# Patient Record
Sex: Female | Born: 1985 | Race: Black or African American | Hispanic: No | Marital: Single | Smoking: Never smoker
Health system: Southern US, Community
[De-identification: ages and names within clinical notes are randomized; demographics above are authoritative.]

## PROBLEM LIST (undated history)

## (undated) DIAGNOSIS — M1990 Unspecified osteoarthritis, unspecified site: Secondary | ICD-10-CM

## (undated) DIAGNOSIS — R55 Syncope and collapse: Secondary | ICD-10-CM

## (undated) HISTORY — DX: Unspecified osteoarthritis, unspecified site: M19.90

## (undated) HISTORY — DX: Syncope and collapse: R55

---
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
## 2004-03-06 ENCOUNTER — Inpatient Hospital Stay (HOSPITAL_COMMUNITY): Admission: AD | Admit: 2004-03-06 | Discharge: 2004-03-06 | Payer: Self-pay | Admitting: Obstetrics

## 2004-03-16 ENCOUNTER — Inpatient Hospital Stay (HOSPITAL_COMMUNITY): Admission: AD | Admit: 2004-03-16 | Discharge: 2004-03-18 | Payer: Self-pay | Admitting: Obstetrics and Gynecology

## 2004-04-17 ENCOUNTER — Other Ambulatory Visit: Admission: RE | Admit: 2004-04-17 | Discharge: 2004-04-17 | Payer: Self-pay | Admitting: Obstetrics and Gynecology

## 2005-05-22 ENCOUNTER — Other Ambulatory Visit: Admission: RE | Admit: 2005-05-22 | Discharge: 2005-05-22 | Payer: Self-pay | Admitting: Obstetrics and Gynecology

## 2006-05-23 ENCOUNTER — Other Ambulatory Visit: Admission: RE | Admit: 2006-05-23 | Discharge: 2006-05-23 | Payer: Self-pay | Admitting: Obstetrics and Gynecology
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
## 2008-09-09 ENCOUNTER — Inpatient Hospital Stay (HOSPITAL_COMMUNITY): Admission: AD | Admit: 2008-09-09 | Discharge: 2008-09-09 | Payer: Self-pay | Admitting: Obstetrics and Gynecology

## 2008-10-05 ENCOUNTER — Inpatient Hospital Stay (HOSPITAL_COMMUNITY): Admission: AD | Admit: 2008-10-05 | Discharge: 2008-10-08 | Payer: Self-pay | Admitting: Obstetrics and Gynecology

## 2008-10-06 ENCOUNTER — Encounter (INDEPENDENT_AMBULATORY_CARE_PROVIDER_SITE_OTHER): Payer: Self-pay | Admitting: Obstetrics and Gynecology

## 2008-12-01 ENCOUNTER — Emergency Department (HOSPITAL_COMMUNITY): Admission: EM | Admit: 2008-12-01 | Discharge: 2008-12-01 | Payer: Self-pay | Admitting: Family Medicine

## 2010-04-12 LAB — CBC
HCT: 34.7 % — ABNORMAL LOW (ref 36.0–46.0)
Hemoglobin: 11.7 g/dL — ABNORMAL LOW (ref 12.0–15.0)
MCHC: 33.6 g/dL (ref 30.0–36.0)
MCV: 94.9 fL (ref 78.0–100.0)
Platelets: 122 10*3/uL — ABNORMAL LOW (ref 150–400)
RBC: 3.66 MIL/uL — ABNORMAL LOW (ref 3.87–5.11)
RDW: 14.2 % (ref 11.5–15.5)
WBC: 10.3 10*3/uL (ref 4.0–10.5)

## 2010-04-13 LAB — CBC
HCT: 36.1 % (ref 36.0–46.0)
Hemoglobin: 12 g/dL (ref 12.0–15.0)
MCHC: 33.1 g/dL (ref 30.0–36.0)
MCV: 94.4 fL (ref 78.0–100.0)
Platelets: 155 10*3/uL (ref 150–400)
RBC: 3.83 MIL/uL — ABNORMAL LOW (ref 3.87–5.11)
RDW: 14.3 % (ref 11.5–15.5)
WBC: 8.3 10*3/uL (ref 4.0–10.5)

## 2010-05-25 NOTE — Consult Note (Signed)
[facility]  Patient:    Courtney Hampton, Courtney Hampton                    MRN: [id number] Proc. Date: 10/14/99 Adm. Date:  [id number] Disc. Date: [id number] Attending:  Benny Lennert                          Consultation Report  DATE OF BIRTH:  1985/09/11.  REFERRING FACILITY:  [facility].  REASON FOR EVALUATION:  Fainting spells.  HISTORY OF PRESENT ILLNESS:  This is an initial emergency room consultation evaluation for this 25 year old black female with a past medical history of seasonal allergies, brought to the emergency room after three spells earlier today.  Patient reports that she went to sleep around 11 oclock last night, awoke at 8:30 today, ate two bowls of oatmeal and went back to bed.  She did not get up again until about 1 p.m., at which point she walked out of bed and was walking across the floor when she suddenly felt very light-headed and her vision went dark.  She then fell to the ground and the next thing she remembers is waking up on the floor.  Her stepfather witnessed the event and says that there was some twitching of her extremities but there was no posturing, no bowel or bladder incontinence and no injury.  The event lasted 30 to 60 seconds and she came around over a couple of minutes.  Patient reports that she felt fine after the episode but within the next 10 minutes, had two more identical spells, each time coming to complete recovery after a couple of minutes.  EMS was alerted after the second spell.  The patient received intravenous fluids and felt better after her arrival in the emergency room.  She reports that she has had similar dizzy sensations several times in the past, but she has never passed out before.  She denies any preceding aura of taste, smell or any other focal neurological sensation; she also denies chest pain, palpitations and shortness of breath.  PAST MEDICAL HISTORY:  Remarkable for  allergies.  FAMILY HISTORY:  Her mother reports that she had similar spells in her youth and grew out of it.  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  MEDICINES:  Allegra and Claritin p.r.n.  REVIEW OF SYSTEMS:  Denies fever, chills, chest pain or recent illness; shortness of breath, palpitations, nausea, vomiting, diarrhea, bowel or bladder symptoms.  PHYSICAL EXAMINATION  VITAL SIGNS:  Temperature 97.8, blood pressure 91/56, pulse 78, respirations 20.  GENERAL:  She is alert and in no evident distress.  HEENT:  Head is normocephalic and atraumatic.  Oropharynx is benign.  NECK:  Supple without bruit.  CHEST:  Clear to auscultation.  HEART:  Regular rate and rhythm without murmurs.  NEUROLOGIC:  Mental status:  She is awake, alert and completely oriented.  She is able to give a clear and concise history.  Mood is euthymic and affect appropriate.  Speech is fluent and not dysarthric.  Cranial nerves: Funduscopic exam is benign.  Pupils are equal and briskly reactive.  Visual fields are full to confrontation.  Extraocular movements are normal without nystagmus.  Facial strength and sensation are normal.  Tongue and palate move well and in the midline.  Motor exam:  Normal bulk and tone.  Normal strength in all tested extremity muscles.  No pronator drift.  Sensation is intact to  light touch in all extremities.  Cerebellar:  Rapid alternating movements are performed well.  Finger-to-nose is performed well.  Reflexes are 1+ globally. Toes are downgoing.  Gait is normal and she is able to tandem walk.  LABORATORY EXAMINATION:  CBC is normal.  CMET is remarkable for a minimally elevated glucose of 119.  EKG is normal except for low voltage.  Urine pregnancy test is negative and urinalysis is normal.  IMAGING STUDY:  CT of the head is normal.  IMPRESSION:  Syncope.  No evidence of seizures.  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  She has a family history  of similar spells.  RECOMMENDATIONS 1. Reassurance that these do not represent neurological phenomena. 2. I advised the patient to sit up out of bed slowly, to avoid starchy meals    and to stay well-hydrated. 3. Follow up with primary M.D.  She may warrant referral to a cardiologist or    possibly neurologist if the spells continue to be troublesome. DD:  10/14/99 TD:  10/15/99 Job: [id number]

## 2011-06-20 IMAGING — US US FETAL BPP W/O NONSTRESS
1 series · 10 of 10 positions shown · non-contrast
Comparison: none

OBSTETRICAL ULTRASOUND:
 This ultrasound exam was performed in the [HOSPITAL] Ultrasound Department.  The OB US report was generated in the AS system, and faxed to the ordering physician.  This report is also available in [REDACTED] PACS.

[Series 1: us fetal bpp w/o nonstress · non-contrast · 0.32mm/px · 10 acquisitions, 10 frames shown]
[im 1/10]
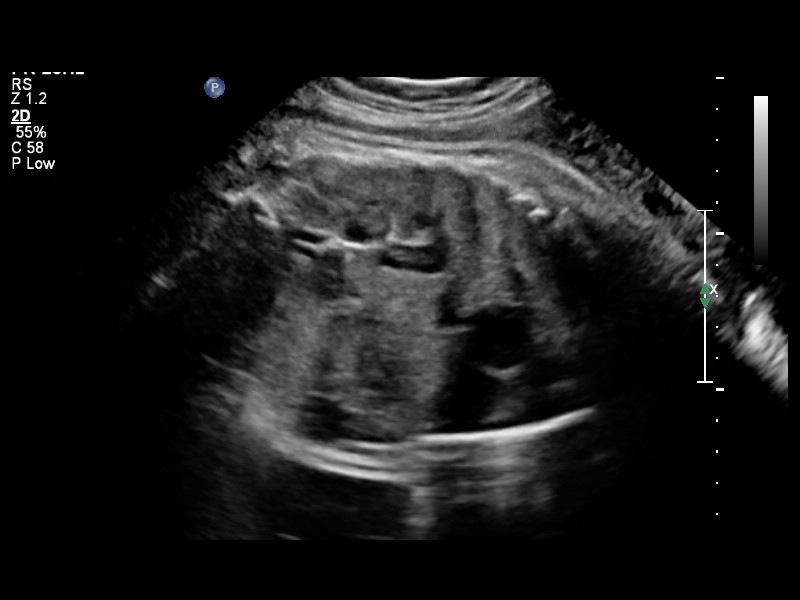
[im 2/10]
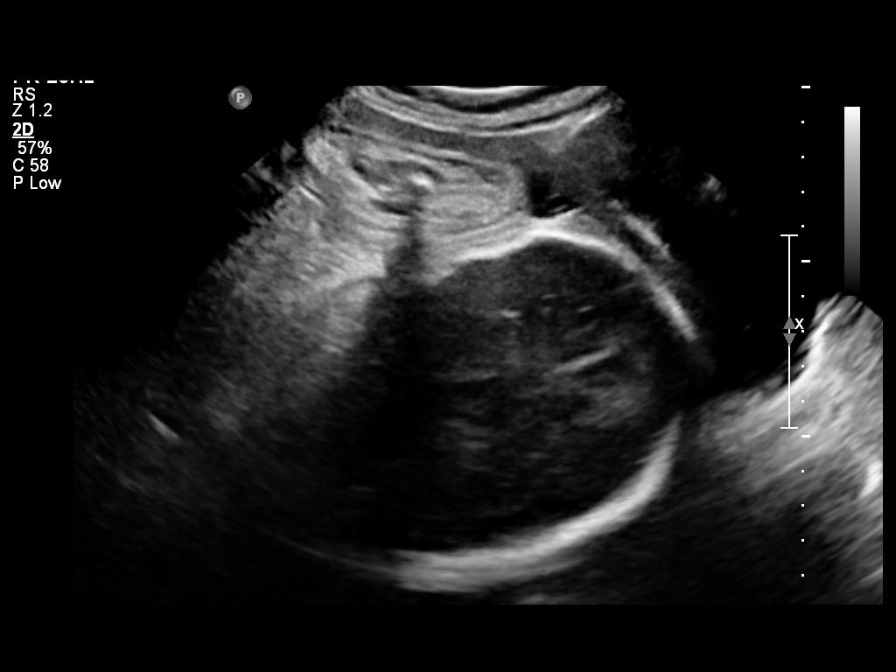
[im 3/10]
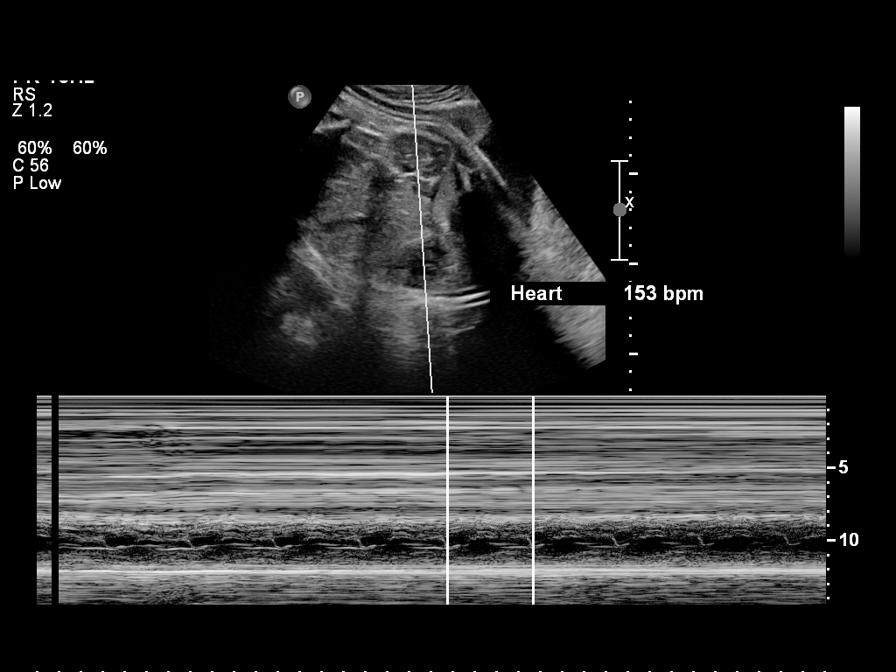
[im 4/10]
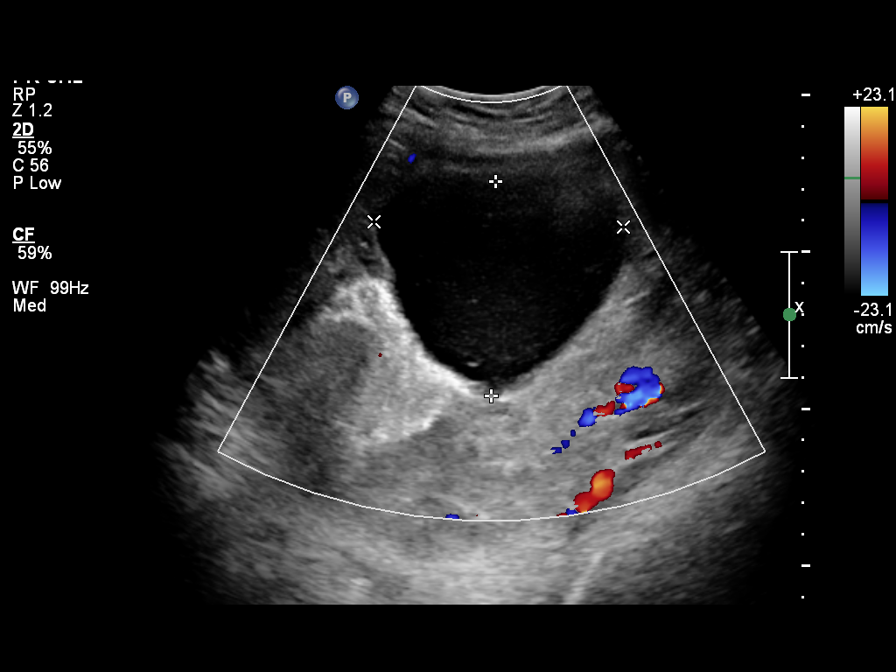
[im 5/10]
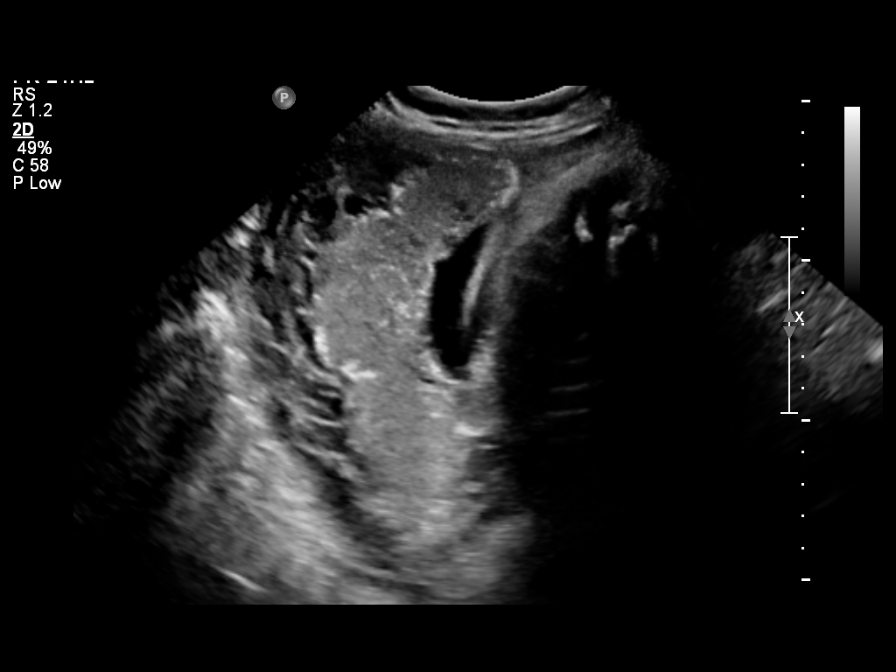
[im 6/10]
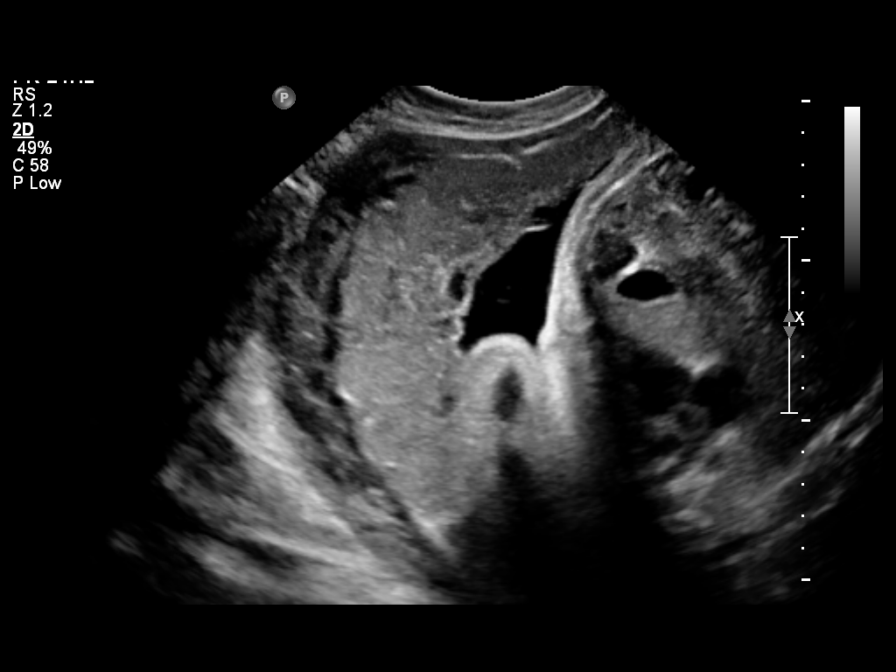
[im 7/10]
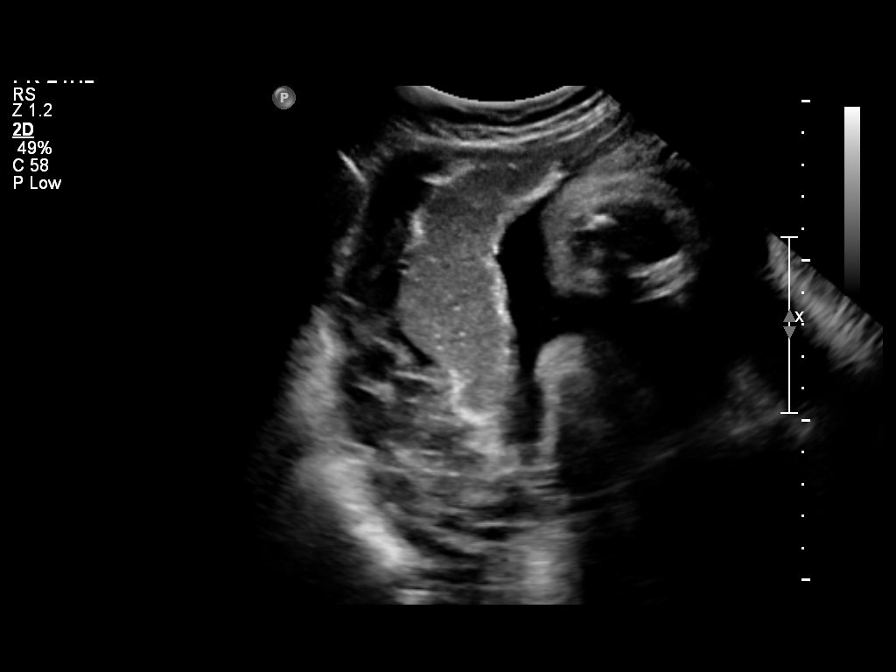
[im 8/10]
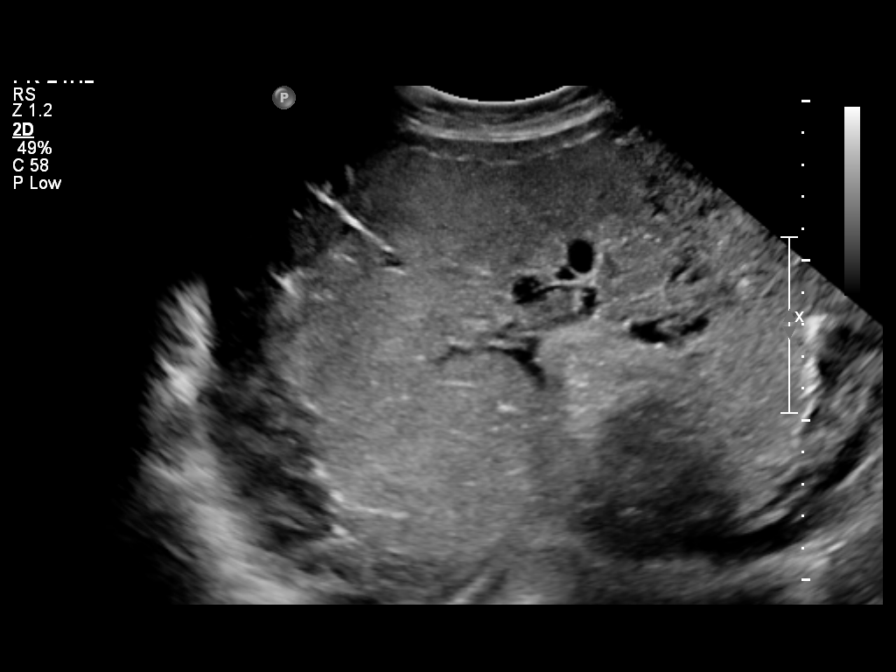
[im 9/10]
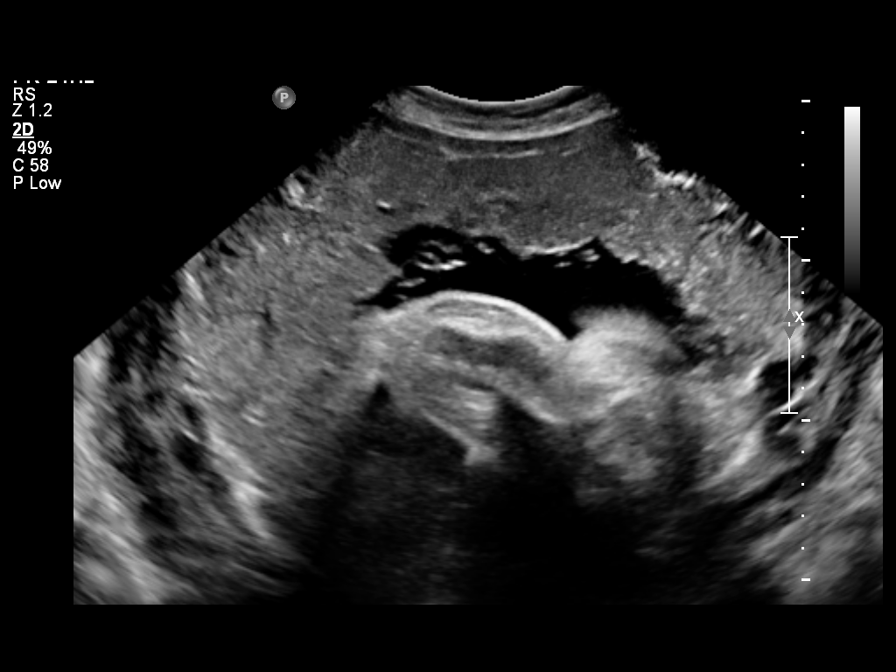
[im 10/10]
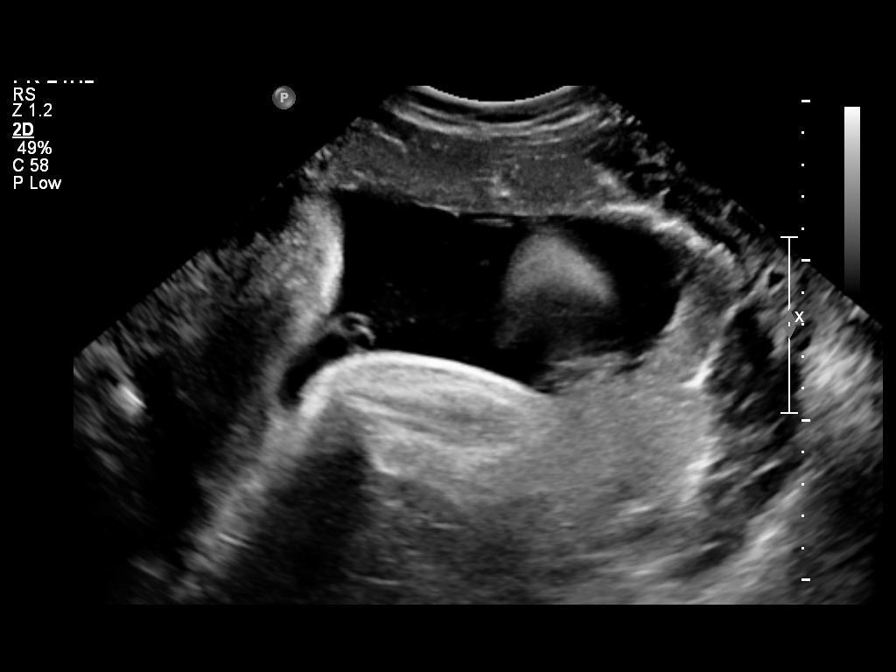

[10 of 10 positions shown; findings below may reference images not displayed]

IMPRESSION: See AS Obstetric US report.

## 2012-02-01 ENCOUNTER — Emergency Department (HOSPITAL_COMMUNITY)
Admission: EM | Admit: 2012-02-01 | Discharge: 2012-02-01 | Disposition: A | Discharge status: Home / Self Care | Payer: 59 | Source: Home / Self Care | Attending: Family Medicine | Admitting: Family Medicine

## 2012-02-01 DIAGNOSIS — J069 Acute upper respiratory infection, unspecified: Secondary | ICD-10-CM

## 2012-02-01 MED ORDER — DEXTROMETHORPHAN POLISTIREX 30 MG/5ML PO LQCR: 60.0000 mg | ORAL | Status: DC | PRN

## 2012-02-01 MED ORDER — IPRATROPIUM BROMIDE 0.06 % NA SOLN: 2.0000 | Freq: Four times a day (QID) | NASAL | Status: DC

## 2012-02-01 MED ORDER — AZITHROMYCIN 250 MG PO TABS: ORAL_TABLET | ORAL | Status: DC

## 2012-02-01 NOTE — Discharge Instructions (Signed)
Drink plenty of fluids as discussed, use medicine as prescribed, and mucinex or delsym for cough. Return or see your doctor if further problems °

## 2012-02-01 NOTE — ED Provider Notes (Signed)
History     CSN: [id number]  Arrival date & time 02/01/12  1230   First MD Initiated Contact with Patient 02/01/12 1247      Chief Complaint  Patient presents with  . URI    (Consider location/radiation/quality/duration/timing/severity/associated sxs/prior treatment) Patient is a 27 y.o. female presenting with URI. The history is provided by the patient.  URI The primary symptoms include sore throat and cough. Primary symptoms do not include fever, nausea or vomiting. The current episode started more than 1 week ago. This is a new problem. The problem has not changed since onset. Symptoms associated with the illness include congestion and rhinorrhea.    No past medical history on file.  No past surgical history on file.  No family history on file.  History  Substance Use Topics  . Smoking status: Not on file  . Smokeless tobacco: Not on file  . Alcohol Use: Not on file    OB History    No data available      Review of Systems  Constitutional: Negative.  Negative for fever.  HENT: Positive for congestion, sore throat, rhinorrhea and postnasal drip.   Respiratory: Positive for cough.   Gastrointestinal: Negative for nausea and vomiting.    Allergies  Penicillins  Home Medications   Current Outpatient Rx  Name  Route  Sig  Dispense  Refill  . AZITHROMYCIN 250 MG PO TABS      Take as directed on pack   6 each   0   . DEXTROMETHORPHAN POLISTIREX ER 30 MG/5ML PO LQCR   Oral   Take 10 mLs (60 mg total) by mouth as needed for cough. Twice a day.   89 mL   0   . IPRATROPIUM BROMIDE 0.06 % NA SOLN   Nasal   Place 2 sprays into the nose 4 (four) times daily.   15 mL   1     BP 106/66  Pulse 76  Temp 98.6 F (37 C) (Oral)  Resp 16  SpO2 100%  Physical Exam  Nursing note and vitals reviewed. Constitutional: She is oriented to person, place, and time. She appears well-developed and well-nourished.  HENT:  Head: Normocephalic.  Right Ear: External  ear normal.  Left Ear: External ear normal.  Nose: Nose normal.  Mouth/Throat: Oropharynx is clear and moist.  Eyes: Conjunctivae normal are normal. Pupils are equal, round, and reactive to light.  Neck: Normal range of motion. Neck supple.  Cardiovascular: Normal rate, regular rhythm, normal heart sounds and intact distal pulses.   Pulmonary/Chest: Effort normal and breath sounds normal.  Lymphadenopathy:    She has no cervical adenopathy.  Neurological: She is alert and oriented to person, place, and time.  Skin: Skin is warm and dry.    ED Course  Procedures (including critical care time)  Labs Reviewed - No data to display No results found.   1. URI (upper respiratory infection)       MDM          Linna Hoff, MD 02/01/12 1400

## 2012-02-01 NOTE — ED Notes (Signed)
Pt c/o poss sinus infection x1 week Sx include: cough w/green mucous, post nasal drip Denies: f/v/n/d, runny nose, nasal congestion, sinus pressure Taking allegra  She is alert w/no signs of acute distress.

## 2012-03-10 ENCOUNTER — Encounter (HOSPITAL_COMMUNITY): Payer: Self-pay | Admitting: *Deleted

## 2012-03-10 ENCOUNTER — Emergency Department (HOSPITAL_COMMUNITY)
Admission: EM | Admit: 2012-03-10 | Discharge: 2012-03-10 | Disposition: A | Discharge status: Home / Self Care | Payer: 59 | Source: Home / Self Care | Attending: Family Medicine | Admitting: Family Medicine

## 2012-03-10 DIAGNOSIS — S0083XA Contusion of other part of head, initial encounter: Secondary | ICD-10-CM

## 2012-03-10 MED ORDER — DICLOFENAC POTASSIUM 50 MG PO TABS: 50.0000 mg | ORAL_TABLET | Freq: Three times a day (TID) | ORAL | Status: DC
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
## 2012-08-21 ENCOUNTER — Other Ambulatory Visit: Payer: Self-pay | Admitting: Obstetrics & Gynecology

## 2012-08-21 DIAGNOSIS — N644 Mastodynia: Secondary | ICD-10-CM

## 2012-08-24 ENCOUNTER — Ambulatory Visit
Admission: RE | Admit: 2012-08-24 | Discharge: 2012-08-24 | Disposition: A | Discharge status: Home / Self Care | Payer: 59 | Source: Ambulatory Visit | Attending: Obstetrics & Gynecology | Admitting: Obstetrics & Gynecology

## 2012-08-24 DIAGNOSIS — N644 Mastodynia: Secondary | ICD-10-CM

## 2014-01-23 ENCOUNTER — Ambulatory Visit (INDEPENDENT_AMBULATORY_CARE_PROVIDER_SITE_OTHER): Payer: Managed Care, Other (non HMO) | Admitting: Family Medicine

## 2014-01-23 VITALS — BP 110/70 | HR 83 | Temp 98.7°F | Resp 16 | Ht 67.5 in | Wt 158.0 lb

## 2014-01-23 DIAGNOSIS — J209 Acute bronchitis, unspecified: Secondary | ICD-10-CM

## 2014-01-23 MED ORDER — AZITHROMYCIN 250 MG PO TABS: ORAL_TABLET | ORAL | Status: DC

## 2014-01-23 MED ORDER — PREDNISONE 20 MG PO TABS: 40.0000 mg | ORAL_TABLET | Freq: Every day | ORAL | Status: DC

## 2014-01-23 MED ORDER — HYDROCODONE-HOMATROPINE 5-1.5 MG/5ML PO SYRP: 5.0000 mL | ORAL_SOLUTION | Freq: Three times a day (TID) | ORAL | Status: DC | PRN

## 2014-01-23 NOTE — Patient Instructions (Signed)

## 2014-01-23 NOTE — Progress Notes (Signed)
° °  Subjective:  This chart was scribed for Elvina SidleKurt Lauenstein, MD, by Veverly FellsHatice Demirci,scribe, at [facility].  This patient was seen in room 2 and the patient's care was started at 8:29 AM.   Patient ID: Courtney Hampton, female    DOB: 27-Oct-1985, 29 y.o.   MRN: [id number]  HPI  HPI Comments: Courtney HawkingLatrece D Wanek is a 29 y.o. female who [facility] with a sore throat which started 5 days ago on Tuesday. Four days ago (Wednesday) she notes she started coughing up mucous and feels like she is being choked (which makes her wake up at night.  Patient notes last night she felt like her chest was tight. She notes she has not slept all week.   Patient denies smoking, asthma.   Patient denies congestion, runny nose, and states this is the first time this has occurred.   Patient works at [employer] [place]          There are no active problems to display for this patient.  History reviewed. No pertinent past medical history. History reviewed. No pertinent past surgical history. Allergies  Allergen Reactions   Penicillins    Prior to Admission medications   Not on File   History   Social History   Marital Status: Single    Spouse Name: N/A    Number of Children: N/A   Years of Education: N/A   Occupational History   Not on file.   Social History Main Topics   Smoking status: Never Smoker    Smokeless tobacco: Not on file   Alcohol Use: No   Drug Use: No   Sexual Activity: Not on file   Other Topics Concern   Not on file   Social History Narrative           Review of Systems  HENT: Negative for rhinorrhea.   Respiratory: Positive for cough.        Objective:   Physical Exam Patient is middle-aged woman in no acute distress appearing stated age HEENT: Unremarkable Chest: Clear after cough Heart: Regular no murmur Extremities: No edema Filed Vitals:   01/23/14 0817  BP: 110/70  Pulse: 83    Temp: 98.7 F (37.1 C)  TempSrc: Oral  Resp: 16  Height: 5' 7.5" (1.715 m)  Weight: 158 lb (71.668 kg)  SpO2: 99%        Assessment & Plan:    This chart was scribed in my presence and reviewed by me personally.    ICD-9-CM ICD-10-CM   1. Acute bronchitis, unspecified organism 466.0 J20.9 azithromycin (ZITHROMAX Z-PAK) 250 MG tablet     predniSONE (DELTASONE) 20 MG tablet     HYDROcodone-homatropine (HYCODAN) 5-1.5 MG/5ML syrup   this lady has what appears to be a violent cough which is been very common in last couple months. Signed, Elvina SidleKurt Lauenstein, MD

## 2014-03-03 DIAGNOSIS — Z0289 Encounter for other administrative examinations: Secondary | ICD-10-CM

## 2014-04-11 ENCOUNTER — Other Ambulatory Visit: Payer: Self-pay | Admitting: Obstetrics and Gynecology

## 2014-04-11 LAB — HM PAP SMEAR

## 2014-04-12 ENCOUNTER — Ambulatory Visit (INDEPENDENT_AMBULATORY_CARE_PROVIDER_SITE_OTHER): Payer: Managed Care, Other (non HMO) | Admitting: Internal Medicine

## 2014-04-12 ENCOUNTER — Other Ambulatory Visit (INDEPENDENT_AMBULATORY_CARE_PROVIDER_SITE_OTHER): Payer: Managed Care, Other (non HMO)

## 2014-04-12 ENCOUNTER — Encounter: Payer: Self-pay | Admitting: Internal Medicine

## 2014-04-12 VITALS — BP 90/50 | HR 74 | Temp 98.0°F | Resp 16 | Ht 65.5 in | Wt 156.0 lb

## 2014-04-12 DIAGNOSIS — G5602 Carpal tunnel syndrome, left upper limb: Secondary | ICD-10-CM | POA: Diagnosis not present

## 2014-04-12 DIAGNOSIS — R55 Syncope and collapse: Secondary | ICD-10-CM | POA: Insufficient documentation

## 2014-04-12 DIAGNOSIS — G5601 Carpal tunnel syndrome, right upper limb: Secondary | ICD-10-CM

## 2014-04-12 DIAGNOSIS — Z299 Encounter for prophylactic measures, unspecified: Secondary | ICD-10-CM

## 2014-04-12 DIAGNOSIS — R5383 Other fatigue: Secondary | ICD-10-CM

## 2014-04-12 DIAGNOSIS — G5603 Carpal tunnel syndrome, bilateral upper limbs: Secondary | ICD-10-CM

## 2014-04-12 DIAGNOSIS — Z23 Encounter for immunization: Secondary | ICD-10-CM

## 2014-04-12 DIAGNOSIS — Z418 Encounter for other procedures for purposes other than remedying health state: Secondary | ICD-10-CM

## 2014-04-12 LAB — TSH: TSH: 2.2 u[IU]/mL (ref 0.35–4.50)

## 2014-04-12 LAB — LIPID PANEL
CHOL/HDL RATIO: 4
CHOLESTEROL: 138 mg/dL (ref 0–200)
HDL: 38.3 mg/dL — ABNORMAL LOW (ref >39.00)
LDL CALC: 88 mg/dL (ref 0–99)
NonHDL: 99.7
TRIGLYCERIDES: 61 mg/dL (ref 0.0–149.0)
VLDL: 12.2 mg/dL (ref 0.0–40.0)

## 2014-04-12 LAB — COMPREHENSIVE METABOLIC PANEL
ALT: 15 U/L (ref 0–35)
AST: 21 U/L (ref 0–37)
Albumin: 3.9 g/dL (ref 3.5–5.2)
Alkaline Phosphatase: 61 U/L (ref 39–117)
BILIRUBIN TOTAL: 0.4 mg/dL (ref 0.2–1.2)
BUN: 12 mg/dL (ref 6–23)
CO2: 29 mEq/L (ref 19–32)
CREATININE: 0.61 mg/dL (ref 0.40–1.20)
Calcium: 9.5 mg/dL (ref 8.4–10.5)
Chloride: 102 mEq/L (ref 96–112)
GFR: 148.07 mL/min (ref >60.00)
GLUCOSE: 89 mg/dL (ref 70–99)
Potassium: 4.2 mEq/L (ref 3.5–5.1)
Sodium: 134 mEq/L — ABNORMAL LOW (ref 135–145)
TOTAL PROTEIN: 7.2 g/dL (ref 6.0–8.3)

## 2014-04-12 LAB — CBC
HEMATOCRIT: 35.7 % — AB (ref 36.0–46.0)
Hemoglobin: 12.1 g/dL (ref 12.0–15.0)
MCHC: 34 g/dL (ref 30.0–36.0)
MCV: 88.4 fl (ref 78.0–100.0)
Platelets: 207 10*3/uL (ref 150.0–400.0)
RBC: 4.04 Mil/uL (ref 3.87–5.11)
RDW: 12.7 % (ref 11.5–15.5)
WBC: 4.9 10*3/uL (ref 4.0–10.5)

## 2014-04-12 LAB — VITAMIN B12: VITAMIN B 12: 551 pg/mL (ref 211–911)

## 2014-04-12 LAB — CYTOLOGY - PAP

## 2014-04-12 LAB — FOLATE: FOLATE: 14.2 ng/mL (ref >5.9)

## 2014-04-12 NOTE — Patient Instructions (Signed)
We will check some blood work today and give you the tetanus shot (it is good for 10 years).   Keep working on exercising about 3 times per week to keep your body healthy and feeling energetic.   Health Maintenance Adopting a healthy lifestyle and getting preventive care can go a long way to promote health and wellness. Talk with your health care provider about what schedule of regular examinations is right for you. This is a good chance for you to check in with your provider about disease prevention and staying healthy. In between checkups, there are plenty of things you can do on your own. Experts have done a lot of research about which lifestyle changes and preventive measures are most likely to keep you healthy. Ask your health care provider for more information. WEIGHT AND DIET  Eat a healthy diet  Be sure to include plenty of vegetables, fruits, low-fat dairy products, and lean protein.  Do not eat a lot of foods high in solid fats, added sugars, or salt.  Get regular exercise. This is one of the most important things you can do for your health.  Most adults should exercise for at least 150 minutes each week. The exercise should increase your heart rate and make you sweat (moderate-intensity exercise).  Most adults should also do strengthening exercises at least twice a week. This is in addition to the moderate-intensity exercise.  Maintain a healthy weight  Body mass index (BMI) is a measurement that can be used to identify possible weight problems. It estimates body fat based on height and weight. Your health care provider can help determine your BMI and help you achieve or maintain a healthy weight.  For females 74 years of age and older:   A BMI below 18.5 is considered underweight.  A BMI of 18.5 to 24.9 is normal.  A BMI of 25 to 29.9 is considered overweight.  A BMI of 30 and above is considered obese.  Watch levels of cholesterol and blood lipids  You should start  having your blood tested for lipids and cholesterol at 30 years of age, then have this test every 5 years.  You may need to have your cholesterol levels checked more often if:  Your lipid or cholesterol levels are high.  You are older than 30 years of age.  You are at high risk for heart disease.  CANCER SCREENING   Lung Cancer  Lung cancer screening is recommended for adults 25-45 years old who are at high risk for lung cancer because of a history of smoking.  A yearly low-dose CT scan of the lungs is recommended for people who:  Currently smoke.  Have quit within the past 15 years.  Have at least a 30-pack-year history of smoking. A pack year is smoking an average of one pack of cigarettes a day for 1 year.  Yearly screening should continue until it has been 15 years since you quit.  Yearly screening should stop if you develop a health problem that would prevent you from having lung cancer treatment.  Breast Cancer  Practice breast self-awareness. This means understanding how your breasts normally appear and feel.  It also means doing regular breast self-exams. Let your health care provider know about any changes, no matter how small.  If you are in your 20s or 30s, you should have a clinical breast exam (CBE) by a health care provider every 1-3 years as part of a regular health exam.  If you are  55 or older, have a CBE every year. Also consider having a breast X-ray (mammogram) every year.  If you have a family history of breast cancer, talk to your health care provider about genetic screening.  If you are at high risk for breast cancer, talk to your health care provider about having an MRI and a mammogram every year.  Breast cancer gene (BRCA) assessment is recommended for women who have family members with BRCA-related cancers. BRCA-related cancers include:  Breast.  Ovarian.  Tubal.  Peritoneal cancers.  Results of the assessment will determine the need for  genetic counseling and BRCA1 and BRCA2 testing. Cervical Cancer Routine pelvic examinations to screen for cervical cancer are no longer recommended for nonpregnant women who are considered low risk for cancer of the pelvic organs (ovaries, uterus, and vagina) and who do not have symptoms. A pelvic examination may be necessary if you have symptoms including those associated with pelvic infections. Ask your health care provider if a screening pelvic exam is right for you.   The Pap test is the screening test for cervical cancer for women who are considered at risk.  If you had a hysterectomy for a problem that was not cancer or a condition that could lead to cancer, then you no longer need Pap tests.  If you are older than 65 years, and you have had normal Pap tests for the past 10 years, you no longer need to have Pap tests.  If you have had past treatment for cervical cancer or a condition that could lead to cancer, you need Pap tests and screening for cancer for at least 20 years after your treatment.  If you no longer get a Pap test, assess your risk factors if they change (such as having a new sexual partner). This can affect whether you should start being screened again.  Some women have medical problems that increase their chance of getting cervical cancer. If this is the case for you, your health care provider may recommend more frequent screening and Pap tests.  The human papillomavirus (HPV) test is another test that may be used for cervical cancer screening. The HPV test looks for the virus that can cause cell changes in the cervix. The cells collected during the Pap test can be tested for HPV.  The HPV test can be used to screen women 54 years of age and older. Getting tested for HPV can extend the interval between normal Pap tests from three to five years.  An HPV test also should be used to screen women of any age who have unclear Pap test results.  After 30 years of age, women  should have HPV testing as often as Pap tests.  Colorectal Cancer  This type of cancer can be detected and often prevented.  Routine colorectal cancer screening usually begins at 30 years of age and continues through 30 years of age.  Your health care provider may recommend screening at an earlier age if you have risk factors for colon cancer.  Your health care provider may also recommend using home test kits to check for hidden blood in the stool.  A small camera at the end of a tube can be used to examine your colon directly (sigmoidoscopy or colonoscopy). This is done to check for the earliest forms of colorectal cancer.  Routine screening usually begins at age 65.  Direct examination of the colon should be repeated every 5-10 years through 30 years of age. However, you may need  to be screened more often if early forms of precancerous polyps or small growths are found. Skin Cancer  Check your skin from head to toe regularly.  Tell your health care provider about any new moles or changes in moles, especially if there is a change in a mole's shape or color.  Also tell your health care provider if you have a mole that is larger than the size of a pencil eraser.  Always use sunscreen. Apply sunscreen liberally and repeatedly throughout the day.  Protect yourself by wearing long sleeves, pants, a wide-brimmed hat, and sunglasses whenever you are outside. HEART DISEASE, DIABETES, AND HIGH BLOOD PRESSURE   Have your blood pressure checked at least every 1-2 years. High blood pressure causes heart disease and increases the risk of stroke.  If you are between 102 years and 79 years old, ask your health care provider if you should take aspirin to prevent strokes.  Have regular diabetes screenings. This involves taking a blood sample to check your fasting blood sugar level.  If you are at a normal weight and have a low risk for diabetes, have this test once every three years after 30 years  of age.  If you are overweight and have a high risk for diabetes, consider being tested at a younger age or more often. PREVENTING INFECTION  Hepatitis B  If you have a higher risk for hepatitis B, you should be screened for this virus. You are considered at high risk for hepatitis B if:  You were born in a country where hepatitis B is common. Ask your health care provider which countries are considered high risk.  Your parents were born in a high-risk country, and you have not been immunized against hepatitis B (hepatitis B vaccine).  You have HIV or AIDS.  You use needles to inject street drugs.  You live with someone who has hepatitis B.  You have had sex with someone who has hepatitis B.  You get hemodialysis treatment.  You take certain medicines for conditions, including cancer, organ transplantation, and autoimmune conditions. Hepatitis C  Blood testing is recommended for:  Everyone born from [birth year] through 1966.  Anyone with known risk factors for hepatitis C. Sexually transmitted infections (STIs)  You should be screened for sexually transmitted infections (STIs) including gonorrhea and chlamydia if:  You are sexually active and are younger than 30 years of age.  You are older than 30 years of age and your health care provider tells you that you are at risk for this type of infection.  Your sexual activity has changed since you were last screened and you are at an increased risk for chlamydia or gonorrhea. Ask your health care provider if you are at risk.  If you do not have HIV, but are at risk, it may be recommended that you take a prescription medicine daily to prevent HIV infection. This is called pre-exposure prophylaxis (PrEP). You are considered at risk if:  You are sexually active and do not regularly use condoms or know the HIV status of your partner(s).  You take drugs by injection.  You are sexually active with a partner who has HIV. Talk with your  health care provider about whether you are at high risk of being infected with HIV. If you choose to begin PrEP, you should first be tested for HIV. You should then be tested every 3 months for as long as you are taking PrEP.  PREGNANCY   If you are premenopausal  and you may become pregnant, ask your health care provider about preconception counseling.  If you may become pregnant, take 400 to 800 micrograms (mcg) of folic acid every day.  If you want to prevent pregnancy, talk to your health care provider about birth control (contraception). OSTEOPOROSIS AND MENOPAUSE   Osteoporosis is a disease in which the bones lose minerals and strength with aging. This can result in serious bone fractures. Your risk for osteoporosis can be identified using a bone density scan.  If you are 34 years of age or older, or if you are at risk for osteoporosis and fractures, ask your health care provider if you should be screened.  Ask your health care provider whether you should take a calcium or vitamin D supplement to lower your risk for osteoporosis.  Menopause may have certain physical symptoms and risks.  Hormone replacement therapy may reduce some of these symptoms and risks. Talk to your health care provider about whether hormone replacement therapy is right for you.  HOME CARE INSTRUCTIONS   Schedule regular health, dental, and eye exams.  Stay current with your immunizations.   Do not use any tobacco products including cigarettes, chewing tobacco, or electronic cigarettes.  If you are pregnant, do not drink alcohol.  If you are breastfeeding, limit how much and how often you drink alcohol.  Limit alcohol intake to no more than 1 drink per day for nonpregnant women. One drink equals 12 ounces of beer, 5 ounces of wine, or 1 ounces of hard liquor.  Do not use street drugs.  Do not share needles.  Ask your health care provider for help if you need support or information about quitting  drugs.  Tell your health care provider if you often feel depressed.  Tell your health care provider if you have ever been abused or do not feel safe at home. Document Released: 07/09/2010 Document Revised: 05/10/2013 Document Reviewed: 11/25/2012 [facility] Patient Information 2015 [place], [place]. This information is not intended to replace advice given to you by your health care provider. Make sure you discuss any questions you have with your health care provider.

## 2014-04-12 NOTE — Progress Notes (Signed)
Pre visit review using our clinic review tool, if applicable. No additional management support is needed unless otherwise documented below in the visit note. 

## 2014-04-14 ENCOUNTER — Encounter: Payer: Self-pay | Admitting: Internal Medicine

## 2014-04-14 DIAGNOSIS — G5600 Carpal tunnel syndrome, unspecified upper limb: Secondary | ICD-10-CM | POA: Insufficient documentation

## 2014-04-14 NOTE — Progress Notes (Signed)
   Subjective:    Patient ID: Courtney HawkingLatrece D Bolds, female    DOB: 29-Apr-1985, 29 y.o.   MRN: [id number]  HPI The patient is a 29 YO female who is coming in for hand numbness and tingling. Mostly after she is working at her job for long times. She has noticed it in the last 6 months and it has been stable since then. A mild annoyance. Has not tried anything for it and has not sought care for it in the past. Does not involve the upper arm.   [facility], social history reviewed and updated.   Review of Systems  Constitutional: Negative for fever, activity change, appetite change, fatigue and unexpected weight change.  HENT: Negative.   Eyes: Negative.   Respiratory: Negative for cough, chest tightness, shortness of breath and wheezing.   Cardiovascular: Negative for chest pain, palpitations and leg swelling.  Gastrointestinal: Negative for abdominal pain, diarrhea, constipation and abdominal distention.  Musculoskeletal: Negative for myalgias, arthralgias, gait problem and neck pain.  Skin: Negative.   Neurological: Positive for numbness. Negative for dizziness, syncope, speech difficulty, weakness, light-headedness and headaches.  Psychiatric/Behavioral: Negative.       Objective:   Physical Exam  Constitutional: She is oriented to person, place, and time. She appears well-developed and well-nourished.  HENT:  Head: Normocephalic and atraumatic.  Eyes: EOM are normal.  Neck: Normal range of motion.  Cardiovascular: Normal rate and regular rhythm.   Pulmonary/Chest: Effort normal and breath sounds normal. No respiratory distress. She has no wheezes. She has no rales.  Abdominal: Soft. Bowel sounds are normal. She exhibits no distension. There is no tenderness. There is no rebound.  Musculoskeletal: She exhibits no edema.  Neurological: She is alert and oriented to person, place, and time. Coordination normal.  Skin: Skin is warm and dry.  Psychiatric: She has a normal mood and affect.    Filed Vitals:   04/12/14 1402  BP: 90/50  Pulse: 74  Temp: 98 F (36.7 C)  TempSrc: Oral  Resp: 16  Height: 5' 5.5" (1.664 m)  Weight: 156 lb (70.761 kg)  SpO2: 95%      Assessment & Plan:

## 2014-04-14 NOTE — Assessment & Plan Note (Signed)
Most likely diagnosis. Will trial splinting and see if this helps. If no relief she would like to do nerve conduction test to confirm and will pursue if no improvement. Other etiology are much less likely including cervical dysfunction since no prior injury and young.

## 2014-08-08 ENCOUNTER — Ambulatory Visit (INDEPENDENT_AMBULATORY_CARE_PROVIDER_SITE_OTHER): Payer: Managed Care, Other (non HMO) | Admitting: Internal Medicine

## 2014-08-08 ENCOUNTER — Encounter: Payer: Self-pay | Admitting: Internal Medicine

## 2014-08-08 VITALS — BP 94/64 | HR 72 | Temp 98.6°F | Resp 16 | Ht 65.0 in | Wt 149.8 lb

## 2014-08-08 DIAGNOSIS — G5601 Carpal tunnel syndrome, right upper limb: Secondary | ICD-10-CM

## 2014-08-08 DIAGNOSIS — B379 Candidiasis, unspecified: Secondary | ICD-10-CM | POA: Diagnosis not present

## 2014-08-08 DIAGNOSIS — G5602 Carpal tunnel syndrome, left upper limb: Secondary | ICD-10-CM | POA: Diagnosis not present

## 2014-08-08 DIAGNOSIS — G5603 Carpal tunnel syndrome, bilateral upper limbs: Secondary | ICD-10-CM

## 2014-08-08 MED ORDER — FLUCONAZOLE 150 MG PO TABS
150.0000 mg | ORAL_TABLET | ORAL | Status: DC
Start: 2014-08-08 — End: 2015-12-07

## 2014-08-08 NOTE — Assessment & Plan Note (Signed)
Refer to hand surgery for injection versus evaluation for surgery for her carpal tunnel. Is bilateral but worse in the left.

## 2014-08-08 NOTE — Progress Notes (Signed)
Pre visit review using our clinic review tool, if applicable. No additional management support is needed unless otherwise documented below in the visit note. 

## 2014-08-08 NOTE — Progress Notes (Signed)
   Subjective:    Patient ID: Courtney Hampton, female    DOB: March 23, 1985, 29 y.o.   MRN: [id number]  HPI The patient is a 29 YO female coming in for 2 acute complaints. The first is recurrent yeast infections that have been going on the last 1.5 years. She has talked to her gyn but is not satisfied. She had her mirena replaced about 1 year ago when the worsening started. She was treated a couple weeks ago and still having some discharge. More than 2 infections per month which are extended (1-2 weeks each episode). Has made many changes in her diet to help with it without success.  Next problem is her carpal tunnel. In her left arm worsening to the point where she cannot drive without numbness or pain. She keeps it in a wrap which helps some but not that much. Worse in the last month. 8/10 pain.   Review of Systems  Constitutional: Negative.   Respiratory: Negative.   Cardiovascular: Negative.   Gastrointestinal: Negative.   Genitourinary: Positive for vaginal discharge. Negative for urgency, frequency, flank pain, vaginal bleeding, genital sores, vaginal pain and pelvic pain.  Musculoskeletal: Positive for arthralgias.  Neurological: Positive for numbness. Negative for dizziness, weakness and headaches.      Objective:   Physical Exam  Constitutional: She is oriented to person, place, and time. She appears well-developed and well-nourished.  HENT:  Head: Normocephalic and atraumatic.  Eyes: EOM are normal.  Cardiovascular: Normal rate and regular rhythm.   Pulmonary/Chest: Effort normal. No respiratory distress. She has no wheezes.  Abdominal: Soft. Bowel sounds are normal.  Neurological: She is alert and oriented to person, place, and time. Coordination normal.  Skin: Skin is warm and dry.   Filed Vitals:   08/08/14 1351  BP: 94/64  Pulse: 72  Temp: 98.6 F (37 C)  TempSrc: Oral  Resp: 16  Height:  (1.651 m)  Weight: 149 lb 12.8 oz (67.949 kg)  SpO2: 99%        Assessment & Plan:

## 2014-08-08 NOTE — Patient Instructions (Signed)
We have sent in the prescription for the diflucan. We would like you to take 1 pill every 3 days for 2 weeks. Then take 1 pill weekly for 3 months. We will see if we can eliminate the yeast.   We will also get you in with the hand surgeon for the carpal tunnel to get you doing better.

## 2014-08-08 NOTE — Assessment & Plan Note (Signed)
Will try extended diflucan taper with q 3 days for 2 weeks then weekly for 3 months. If no resolution have asked her to consider getting rid of her IUD and talked to her about other contraception options including implant which would provide the same protection.

## 2014-08-15 ENCOUNTER — Other Ambulatory Visit: Payer: Self-pay | Admitting: Obstetrics & Gynecology

## 2014-10-18 ENCOUNTER — Ambulatory Visit: Payer: Managed Care, Other (non HMO) | Admitting: Internal Medicine

## 2014-10-18 DIAGNOSIS — Z0289 Encounter for other administrative examinations: Secondary | ICD-10-CM

## 2014-11-01 ENCOUNTER — Ambulatory Visit: Payer: Managed Care, Other (non HMO) | Admitting: Internal Medicine

## 2014-12-20 ENCOUNTER — Telehealth: Payer: Self-pay

## 2014-12-20 NOTE — Telephone Encounter (Signed)
Left Voice Mail for pt to call back.   RE: Flu Vaccine for 2016  

## 2015-03-14 ENCOUNTER — Telehealth: Payer: Self-pay | Admitting: Internal Medicine

## 2015-03-14 NOTE — Telephone Encounter (Signed)
[facility] - Client TELEPHONE ADVICE RECORD [facility] Patient Name: Courtney Hampton DOB: Jul 24, 1985 Initial Comment Caller states she has had diarrhea for about 10 days- went to an urgent care [facility]- dx stomach bug- There was a little pink when she went to go. unsure if it is blood in stool to area is raw Nurse Assessment Nurse: Izola PriceMyers, RN, Cala BradfordKimberly Date/Time Lamount Cohen(Eastern Time): 03/14/2015 3:14:26 PM Confirm and document reason for call. If symptomatic, describe symptoms. You must click the next button to save text entered. ---Caller states she has had diarrhea for about 10 days- went to an urgent care [facility]- dx stomach bug- There was a little pink when she went to go. unsure if it is blood in stool to area is raw. No abd. pain but does rumble. 5 or 6 stools per day. Has had no stool today but when she eats, this will cause stool so pt hasn't eaten. Afebrile and no vomiting. Drops of blood and not sure if stool was mixed with blood due to large amt of stool. Has the patient traveled out of the country within the last 30 days? ---No Does the patient have any new or worsening symptoms? ---Yes Will a triage be completed? ---Yes Related visit to physician within the last 2 weeks? ---Yes Does the PT have any chronic conditions? (i.e. diabetes, asthma, etc.) ---No Is the patient pregnant or possibly pregnant? (Ask all females between the ages of 5412-55) ---No Is this a behavioral health or substance abuse call? ---No Guidelines Guideline Title Affirmed Question Affirmed Notes Diarrhea [1] MODERATE diarrhea (e.g., 4-6 times / day more than normal) AND [2] present > 48 hours (2 days) Final Disposition User See Physician within 24 Hours HiddeniteMyers, Charity fundraiserN, RaytheonKimberly

## 2015-03-14 NOTE — Telephone Encounter (Signed)
Appt has been made for tomorrow 03/15/15 w/Carrie doss, NP.../lmb

## 2015-03-15 ENCOUNTER — Ambulatory Visit (INDEPENDENT_AMBULATORY_CARE_PROVIDER_SITE_OTHER): Payer: Managed Care, Other (non HMO) | Admitting: Nurse Practitioner

## 2015-03-15 ENCOUNTER — Encounter: Payer: Self-pay | Admitting: Nurse Practitioner

## 2015-03-15 ENCOUNTER — Other Ambulatory Visit: Payer: Managed Care, Other (non HMO)

## 2015-03-15 VITALS — BP 110/70 | HR 71 | Temp 98.7°F | Wt 148.0 lb

## 2015-03-15 DIAGNOSIS — A09 Infectious gastroenteritis and colitis, unspecified: Secondary | ICD-10-CM

## 2015-03-15 DIAGNOSIS — R197 Diarrhea, unspecified: Secondary | ICD-10-CM

## 2015-03-15 NOTE — Progress Notes (Signed)
Patient ID: Courtney HawkingLatrece D Hampton, female    DOB: June 25, 1985  Age: 30 y.o. MRN: [id number]  CC: Diarrhea   HPI Courtney Hampton presents for CC of diarrhea x 11 days.   1) Pt reports she may have seen blood in stool on Monday Tried imodium OTC with no relief  Preparation H- pruritic   Clindamycin For tooth removal in early Feb. Was on abx for 2 weeks straight.   Feels fine except for diarrhea and a little bit of nausea  Poor appetite due to being scared to eat   First 3-4 days going every 30 minutes First 7 days days- water  Slowed down to twice daily and loose  Monday possibly blood on toilet paper with 2 BMs on that day   Pink and small amount   Probiotic daily since Friday  Staying hydrated, urinating regularly pale yellow   Daughter had stomach bug prior to her having this UC- told her it was Norovirus Friday   History Amayrani has a past medical history of Arthritis and Fainting spell.   She has no past surgical history on file.   Her family history includes Arthritis in her maternal grandfather, maternal grandmother, paternal grandfather, and paternal grandmother; Cancer in her paternal aunt; Diabetes in her maternal grandmother, mother, and paternal grandfather; Heart attack in her father.She reports that she has never smoked. She has never used smokeless tobacco. She reports that she does not drink alcohol or use illicit drugs.  Outpatient Prescriptions Prior to Visit  Medication Sig Dispense Refill  . fluconazole (DIFLUCAN) 150 MG tablet Take 1 tablet (150 mg total) by mouth every 3 (three) days. 15 tablet 2  . Lactobacillus (PROBIOTIC ACIDOPHILUS PO) Take by mouth. Take one by mouth daily.     No facility-administered medications prior to visit.    ROS Review of Systems  Constitutional: Negative for fever, chills, diaphoresis and fatigue.  Respiratory: Negative for chest tightness, shortness of breath and wheezing.   Cardiovascular: Negative for chest pain,  palpitations and leg swelling.  Gastrointestinal: Positive for diarrhea, anal bleeding and rectal pain. Negative for nausea, vomiting, abdominal pain, constipation, blood in stool and abdominal distention.  Skin: Negative for rash.  Neurological: Negative for dizziness, weakness, numbness and headaches.  Psychiatric/Behavioral: The patient is not nervous/anxious.     Objective:  BP 110/70 mmHg  Pulse 71  Temp(Src) 98.7 F (37.1 C) (Oral)  Wt 148 lb (67.132 kg)  SpO2 98%  Physical Exam  Constitutional: She is oriented to person, place, and time. She appears well-developed and well-nourished. No distress.  HENT:  Head: Normocephalic and atraumatic.  Right Ear: External ear normal.  Left Ear: External ear normal.  Cardiovascular: Normal rate, regular rhythm and normal heart sounds.  Exam reveals no gallop and no friction rub.   No murmur heard. Pulmonary/Chest: Effort normal and breath sounds normal. No respiratory distress. She has no wheezes. She has no rales. She exhibits no tenderness.  Abdominal: Soft. Bowel sounds are normal. She exhibits no distension and no mass. There is tenderness. There is no rebound.  Tender epigastric area  Genitourinary:  Deferred-getting culture  Neurological: She is alert and oriented to person, place, and time. No cranial nerve deficit. She exhibits normal muscle tone. Coordination normal.  Skin: Skin is warm and dry. No rash noted. She is not diaphoretic.  Psychiatric: She has a normal mood and affect. Her behavior is normal. Judgment and thought content normal.   Assessment & Plan:   Sheng was  seen today for diarrhea.  Diagnoses and all orders for this visit:  Diarrhea of presumed infectious origin -     Stool Culture; Future -     Clostridium Difficile by PCR   I am having Ms. Meloche maintain her Lactobacillus (PROBIOTIC ACIDOPHILUS PO) and fluconazole.  No orders of the defined types were placed in this encounter.     Follow-up:  Return if symptoms worsen or fail to improve.

## 2015-03-15 NOTE — Addendum Note (Signed)
Addended by: Verlan FriendsAIRRIKIER DAVIDSON, Antonella Upson M on: 03/15/2015 04:33 PM   Modules accepted: Orders

## 2015-03-15 NOTE — Assessment & Plan Note (Signed)
Pt has recently taken Clindamycin for tooth extraction in early feb New onset 11 days of diarrhea and anal irritation  Pt on probiotic and watery stools are improving  Will obtain Culture and C diff testing.  FU prn worsening/failure to improve.

## 2015-03-15 NOTE — Patient Instructions (Signed)
Keep up the good work and we will get your results to you asap.   Keep taking probiotics.

## 2015-03-15 NOTE — Progress Notes (Signed)
Pre visit review using our clinic review tool, if applicable. No additional management support is needed unless otherwise documented below in the visit note. 

## 2015-03-15 NOTE — Addendum Note (Signed)
Addended by: Verlan FriendsAIRRIKIER DAVIDSON, Keyle Doby M on: 03/15/2015 04:35 PM   Modules accepted: Orders

## 2015-03-16 ENCOUNTER — Telehealth: Payer: Self-pay | Admitting: Nurse Practitioner

## 2015-03-16 ENCOUNTER — Telehealth: Payer: Self-pay | Admitting: Emergency Medicine

## 2015-03-16 ENCOUNTER — Other Ambulatory Visit: Payer: Self-pay | Admitting: Nurse Practitioner

## 2015-03-16 LAB — CLOSTRIDIUM DIFFICILE BY PCR: Toxigenic C. Difficile by PCR: DETECTED — CR

## 2015-03-16 MED ORDER — METRONIDAZOLE 500 MG PO TABS: 500.0000 mg | ORAL_TABLET | Freq: Three times a day (TID) | ORAL | Status: DC

## 2015-03-16 NOTE — Telephone Encounter (Signed)
[facility] called to inform that pt test positive fr C-Diff. Please advise if anything is needed for pt.

## 2015-03-16 NOTE — Telephone Encounter (Signed)
Only number in chart. Unable to reach patient. Will speak with her tomorrow. [facility]

## 2015-03-16 NOTE — Telephone Encounter (Signed)
No answer. Left voicemail once already.

## 2015-03-16 NOTE — Telephone Encounter (Signed)
This lab result should go to ordering physician and needs treatment. They would know severity and best first option: flagyl if mild and oral vancomycin for moderate. If severe she would require hospital admission.

## 2015-03-16 NOTE — Telephone Encounter (Signed)
Need to start pt on Flagyl for C. Diff infection. 1st attempt to call was leaving message to call back.

## 2015-03-17 ENCOUNTER — Telehealth: Payer: Self-pay | Admitting: Nurse Practitioner

## 2015-03-17 NOTE — Telephone Encounter (Signed)
Spoke with Naomie Deanarrie Doss, NP. She is aware.

## 2015-03-17 NOTE — Telephone Encounter (Signed)
Spoke with pt, gave instructions- wash hands with warm water and soap, use clorox to clean bathroom, and flagyl instructions- no alcohol for 10 days and 2 days after, call us if no improvement in 7 days. Seek emergency care if anything of concern comes up- bloody stool, increased diarrhea ect... Pt verbalized understanding.

## 2015-03-31 ENCOUNTER — Telehealth: Payer: Self-pay | Admitting: Internal Medicine

## 2015-03-31 NOTE — Telephone Encounter (Signed)
Pt seen Lyla SonCarrie a couple weeks ago and was given an antibiotic for c diff and she finished her antibiotic on Sunday and she is now having the same symptoms again. Please advise

## 2015-03-31 NOTE — Telephone Encounter (Signed)
Patient needs another appt, per Naomie Deanarrie Doss. She has been scheduled tomorrow with Dr. Lawerance BachBurns at 11:15.

## 2015-04-01 ENCOUNTER — Encounter: Payer: Self-pay | Admitting: Internal Medicine

## 2015-04-01 ENCOUNTER — Ambulatory Visit (INDEPENDENT_AMBULATORY_CARE_PROVIDER_SITE_OTHER): Payer: Managed Care, Other (non HMO) | Admitting: Internal Medicine

## 2015-04-01 VITALS — BP 112/76 | HR 94 | Temp 98.7°F | Resp 16 | Wt 143.0 lb

## 2015-04-01 DIAGNOSIS — A047 Enterocolitis due to Clostridium difficile: Secondary | ICD-10-CM | POA: Diagnosis not present

## 2015-04-01 DIAGNOSIS — A0472 Enterocolitis due to Clostridium difficile, not specified as recurrent: Secondary | ICD-10-CM

## 2015-04-01 MED ORDER — VANCOMYCIN HCL 125 MG PO CAPS: 125.0000 mg | ORAL_CAPSULE | Freq: Four times a day (QID) | ORAL | Status: DC

## 2015-04-01 NOTE — Progress Notes (Signed)
Pre visit review using our clinic review tool, if applicable. No additional management support is needed unless otherwise documented below in the visit note. 

## 2015-04-01 NOTE — Progress Notes (Signed)
Subjective:    Patient ID: Courtney Hampton, female    DOB: [birth year], 30 y.o.   MRN: [id number]  HPI  She is here for an acute visit for diarrhea.   In February she had an infected tooth and took clindamycin for 10 days and then for additional 4 days.  On 2/24 she developed diarrhea.  The diarrhea persisted and she went to urgent care.  She was told it was likely viral.  The diarrhea persisted and she saw [facility] on 3/8.  She was found to be positive for cdiff and was started on flagyl.  She took flagyl for 10 days.  Her stool improved. When she finished the antibiotics her stool was soft, but solid.  A few days later she developed diarrhea again.  The past couple of days she has had persistent diarrhea.  She states decreased appetite, mild nausea.  She denies abdominal pain and blood it the stool.  She had diarrhea 7 times yesterday and 3 times this morning already.  This is exactly how the diarrhea was when she was diagnosed with cdiff.       Medications and allergies reviewed with patient and updated if appropriate.  Patient Active Problem List   Diagnosis Date Noted  . Diarrhea of presumed infectious origin 03/15/2015  . Yeast infection 08/08/2014  . Carpal tunnel syndrome 04/14/2014    Current Outpatient Prescriptions on File Prior to Visit  Medication Sig Dispense Refill  . fluconazole (DIFLUCAN) 150 MG tablet Take 1 tablet (150 mg total) by mouth every 3 (three) days. 15 tablet 2  . Lactobacillus (PROBIOTIC ACIDOPHILUS PO) Take by mouth. Take one by mouth daily.     No current facility-administered medications on file prior to visit.    Past Medical History  Diagnosis Date  . Arthritis   . Fainting spell     when younger    No past surgical history on file.  Social History   Social History  . Marital Status: Single    Spouse Name: N/A  . Number of Children: N/A  . Years of Education: N/A   Social History Main Topics  . Smoking status: Never Smoker   .  Smokeless tobacco: Never Used  . Alcohol Use: No  . Drug Use: No  . Sexual Activity: Yes    Birth Control/ Protection: IUD   Other Topics Concern  . Not on file   Social History Narrative    Family History  Problem Relation Age of Onset  . Diabetes Mother   . Heart attack Father   . Cancer Paternal Aunt   . Diabetes Maternal Grandmother   . Arthritis Maternal Grandmother   . Arthritis Maternal Grandfather   . Arthritis Paternal Grandmother   . Diabetes Paternal Grandfather   . Arthritis Paternal Grandfather     Review of Systems  Constitutional: Positive for appetite change. Negative for fever and chills.  Gastrointestinal: Positive for nausea and diarrhea. Negative for abdominal pain and blood in stool.  Neurological: Negative for light-headedness and headaches.       Objective:   Filed Vitals:   04/01/15 1118  BP: 112/76  Pulse: 94  Temp: 98.7 F (37.1 C)  Resp: 16   Filed Weights   04/01/15 1118  Weight: 143 lb (64.864 kg)   Body mass index is 23.8 kg/(m^2).   Physical Exam  Constitutional: She appears well-developed and well-nourished. No distress.  Abdominal: Soft. She exhibits no distension. There is no tenderness.  Musculoskeletal:  She exhibits no edema.  Skin: She is not diaphoretic.        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

## 2015-04-01 NOTE — Patient Instructions (Signed)
Take the antibiotic as prescribed.  If your symptoms do not improve call.

## 2015-07-15 ENCOUNTER — Other Ambulatory Visit: Payer: Self-pay | Admitting: Obstetrics & Gynecology

## 2015-07-15 DIAGNOSIS — N644 Mastodynia: Secondary | ICD-10-CM

## 2015-08-10 ENCOUNTER — Ambulatory Visit
Admission: RE | Admit: 2015-08-10 | Discharge: 2015-08-10 | Disposition: A | Discharge status: Home / Self Care | Payer: Managed Care, Other (non HMO) | Source: Ambulatory Visit | Attending: Obstetrics & Gynecology | Admitting: Obstetrics & Gynecology

## 2015-08-10 DIAGNOSIS — N644 Mastodynia: Secondary | ICD-10-CM

## 2015-09-02 LAB — HM PAP SMEAR

## 2015-12-07 ENCOUNTER — Ambulatory Visit (INDEPENDENT_AMBULATORY_CARE_PROVIDER_SITE_OTHER): Payer: Managed Care, Other (non HMO) | Admitting: Internal Medicine

## 2015-12-07 ENCOUNTER — Encounter: Payer: Self-pay | Admitting: Internal Medicine

## 2015-12-07 DIAGNOSIS — R51 Headache: Secondary | ICD-10-CM

## 2015-12-07 DIAGNOSIS — Z8619 Personal history of other infectious and parasitic diseases: Secondary | ICD-10-CM | POA: Insufficient documentation

## 2015-12-07 DIAGNOSIS — R519 Headache, unspecified: Secondary | ICD-10-CM | POA: Insufficient documentation

## 2015-12-07 MED ORDER — SUMATRIPTAN SUCCINATE 50 MG PO TABS: 50.0000 mg | ORAL_TABLET | ORAL | 0 refills | Status: DC | PRN

## 2015-12-07 NOTE — Progress Notes (Signed)
   Subjective:    Patient ID: Courtney Hampton, female    DOB: September 30, 1985, 30 y.o.   MRN: [id number]  HPI The patient is a 30 YO female coming in for headaches. She has been having them the last 1-2 weeks. They start in the neck and go into the side of her neck on the left side. She saw her dentist on Tuesday and they found a cyst on the left side which could be compressing a nerve. She is going to call an oral surgeon to see if they can remove the cyst. She denies light or sound sensitivity during the headache. She has tried aleve which did not touch the pain. Some mild nausea during the episode but no vomiting.   Review of Systems  Constitutional: Negative for activity change, appetite change, fatigue, fever and unexpected weight change.  Respiratory: Negative.   Gastrointestinal: Negative.   Musculoskeletal: Negative.   Skin: Negative.   Neurological: Positive for headaches. Negative for dizziness, syncope, weakness and numbness.      Objective:   Physical Exam  Constitutional: She is oriented to person, place, and time. She appears well-developed and well-nourished.  HENT:  Head: Normocephalic and atraumatic.  Right Ear: External ear normal.  Left Ear: External ear normal.  Nose: Nose normal.  Mouth/Throat: Oropharynx is clear and moist.  Eyes: EOM are normal.  Neck: Normal range of motion.  Cardiovascular: Normal rate and regular rhythm.   Pulmonary/Chest: Effort normal and breath sounds normal.  Abdominal: Soft.  Neurological: She is alert and oriented to person, place, and time. No cranial nerve deficit. Coordination normal.  Skin: Skin is warm and dry.   Vitals:   12/07/15 1115  BP: 112/82  Pulse: 77  Resp: 16  Temp: 98.7 F (37.1 C)  TempSrc: Oral  SpO2: 99%  Weight: 165 lb (74.8 kg)  Height: 5\' 5"  (1.651 m)      Assessment & Plan:

## 2015-12-07 NOTE — Patient Instructions (Signed)
We have sent in imitrex for the headaches which you can take. Take 1 pill at the onset of the headache. This helps to abort the headache or make it go away before it gets severe. If you wait until the headache is severe the medicine can take longer to work.   If the period does not go away by next week call the gynecologist as they may give you a medicine to take temporarily to stop the bleeding.

## 2015-12-07 NOTE — Assessment & Plan Note (Signed)
Rx for imitrex for her headaches. She will call the oral surgeon and hopefully get the cyst removed to help with her pain.

## 2015-12-07 NOTE — Progress Notes (Signed)
Pre visit review using our clinic review tool, if applicable. No additional management support is needed unless otherwise documented below in the visit note. 

## 2015-12-15 ENCOUNTER — Other Ambulatory Visit: Payer: Self-pay | Admitting: Obstetrics & Gynecology

## 2016-02-13 ENCOUNTER — Ambulatory Visit (INDEPENDENT_AMBULATORY_CARE_PROVIDER_SITE_OTHER): Payer: Managed Care, Other (non HMO) | Admitting: Internal Medicine

## 2016-02-13 DIAGNOSIS — N644 Mastodynia: Secondary | ICD-10-CM | POA: Insufficient documentation

## 2016-02-13 MED ORDER — CYCLOBENZAPRINE HCL 5 MG PO TABS: 5.0000 mg | ORAL_TABLET | Freq: Three times a day (TID) | ORAL | 1 refills | Status: AC | PRN

## 2016-02-13 NOTE — Progress Notes (Signed)
   Subjective:    Patient ID: Courtney Hampton, female    DOB: 05-Sep-1985, 31 y.o.   MRN: [id number]  HPI The patient is a 31 YO female coming in for left chest flank pain and left breast pain. She has talked to her gynecologist about this several times in the past. They did mammogram and ultrasound which found some cysts but nothing worrisome. She has cut back caffeine since that time and now only 1 cup green tea daily. This episode started about 1-2 weeks ago. They come every several months or so. Not sharp pain but dull pain about 3/10 all the time. Aleve helped the pain some but it came back. No new breast lumps or nipple discharge. She has mirena so no monthly cycles. Her gynecologist was unable to give her an explanation. Denies activity or exercise recently. No old injury to the area. Overall stable since onset.   Review of Systems  Constitutional: Negative for activity change, appetite change, fatigue, fever and unexpected weight change.  Respiratory: Negative.   Cardiovascular: Negative.   Gastrointestinal: Negative.   Genitourinary:       Left breast pain  Musculoskeletal: Positive for myalgias. Negative for back pain, gait problem, joint swelling, neck pain and neck stiffness.      Objective:   Physical Exam  Constitutional: She is oriented to person, place, and time. She appears well-developed and well-nourished.  HENT:  Head: Normocephalic and atraumatic.  Eyes: EOM are normal.  Neck: Normal range of motion.  Cardiovascular: Normal rate and regular rhythm.   Pulmonary/Chest: Effort normal and breath sounds normal. No respiratory distress. She has no wheezes. She has no rales.  Not tender to touch under the left armpit and no breast lump or axillary LAD, no nipple discharge.   Abdominal: Soft.  Musculoskeletal: She exhibits no edema.  Neurological: She is alert and oriented to person, place, and time.  Skin: Skin is warm and dry.   Vitals:   02/13/16 0846  BP: 110/60    Pulse: 71  Temp: 98.6 F (37 C)  TempSrc: Oral  SpO2: 100%  Weight: 167 lb (75.8 kg)  Height: 5\' 5"  (1.651 m)      Assessment & Plan:

## 2016-02-13 NOTE — Assessment & Plan Note (Signed)
Rx for flexeril for pain and can use aleve as well. No clear etiology on exam. Mammogram within last year normal and us without abnormal findings as well so likely do not need to be repeated.

## 2016-02-13 NOTE — Patient Instructions (Signed)
We have sent in the muscle relaxer called flexeril that you can use for the pain.   If it does not help call and let us know.

## 2016-02-13 NOTE — Progress Notes (Signed)
Pre visit review using our clinic review tool, if applicable. No additional management support is needed unless otherwise documented below in the visit note. 

## 2016-04-12 ENCOUNTER — Ambulatory Visit (INDEPENDENT_AMBULATORY_CARE_PROVIDER_SITE_OTHER): Payer: Managed Care, Other (non HMO) | Admitting: Internal Medicine

## 2016-04-12 ENCOUNTER — Encounter: Payer: Self-pay | Admitting: Internal Medicine

## 2016-04-12 VITALS — BP 122/70 | HR 81 | Temp 98.8°F | Resp 12 | Ht 65.0 in | Wt 164.0 lb

## 2016-04-12 DIAGNOSIS — M25512 Pain in left shoulder: Secondary | ICD-10-CM | POA: Diagnosis not present

## 2016-04-12 MED ORDER — DICLOFENAC SODIUM 1 % TD GEL: 2.0000 g | Freq: Four times a day (QID) | TRANSDERMAL | 3 refills | Status: DC

## 2016-04-12 NOTE — Progress Notes (Signed)
   Subjective:    Patient ID: Courtney Hampton, female    DOB: 1985/05/15, 31 y.o.   MRN: [id number]  HPI The patient is a 31 YO female coming in for left arm/breast pain. This has been ongoing off and on for several months. She has sought explanation from her gynecologist and had mammogram and [place] without findings. She denies injury or overuse of the area. She tried muscle relaxer from [place] several months ago and this does help but makes her very tired and she is unable to take it except in the evening time. She is noticing that it is starting by her left shoulder and goes under to the armpit and side of her breast now instead of the breast pain from before. Gets some better and some worse, no triggers. Tylenol and ibuprofen do not help. Heat did not help much. 2-3/10 pain.   Review of Systems  Constitutional: Negative for activity change, appetite change, diaphoresis, fatigue and unexpected weight change.  Respiratory: Negative.   Cardiovascular: Negative.   Gastrointestinal: Negative.   Musculoskeletal: Positive for arthralgias and myalgias. Negative for back pain, gait problem, joint swelling, neck pain and neck stiffness.  Neurological: Negative.       Objective:   Physical Exam  Constitutional: She is oriented to person, place, and time. She appears well-developed and well-nourished.  HENT:  Head: Normocephalic and atraumatic.  Eyes: EOM are normal.  Cardiovascular: Normal rate and regular rhythm.   Pulmonary/Chest: Effort normal and breath sounds normal. No respiratory distress. She has no wheezes. She has no rales.  Abdominal: Soft.  Musculoskeletal: She exhibits tenderness.  Pain in the left scapula and left should region, no axillary LN or breast mass.  Neurological: She is alert and oriented to person, place, and time.  Skin: Skin is warm and dry.   Vitals:   04/12/16 1553  BP: 122/70  Pulse: 81  Resp: 12  Temp: 98.8 F (37.1 C)  TempSrc: Oral  SpO2: 99%  Weight: 164 lb  (74.4 kg)  Height:  (1.651 m)      Assessment & Plan:

## 2016-04-12 NOTE — Assessment & Plan Note (Signed)
Referral to sports medicine for evaluation and rx for voltaren gel for daytime usage. Can still use flexeril at night time if needed.

## 2016-04-12 NOTE — Progress Notes (Signed)
Pre visit review using our clinic review tool, if applicable. No additional management support is needed unless otherwise documented below in the visit note. 

## 2016-04-12 NOTE — Patient Instructions (Signed)
We have sent in the voltaren gel which you can use where it hurts up to 3 times per day.   We will get you in with Dr. Katrinka Blazing to see if there are any muscle tears.

## 2016-05-07 ENCOUNTER — Ambulatory Visit: Payer: Managed Care, Other (non HMO) | Admitting: Family Medicine

## 2016-05-30 NOTE — Progress Notes (Signed)
[facility] [address] [place], [place] [address] Phone: [phone] Subjective:    I'm seeing this patient by the request  of:  Myrlene Broker, MD   CC: Left shoulder pain  [name]  Courtney Hampton is a 31 y.o. female coming in with complaint of left shoulder pain. Patient states that going on for several months. Patient was concern it was more breast pain and did discuss with her gynecologist including having a mammogram and ultrasound without any significant findings. Patient does not remember any true injury. Patient states he only thing that seemed to help was a muscle relaxer but unfortunately made her too tired. Has not notice any specific triggers. Over-the-counter anti-inflammatories don't help. Denies radiation down the arm. Rates the severity pain as 5 out of 10. Not affecting daily activities. States that there is just an aching sensation at all times.     Past Medical History:  Diagnosis Date  . Arthritis   . Fainting spell    when younger   No past surgical history on file. Social History   Social History  . Marital status: Single    Spouse name: N/A  . Number of children: N/A  . Years of education: N/A   Social History Main Topics  . Smoking status: Never Smoker  . Smokeless tobacco: Never Used  . Alcohol use No  . Drug use: No  . Sexual activity: Yes    Birth control/ protection: IUD   Other Topics Concern  . None   Social History Narrative  . None   Allergies  Allergen Reactions  . Penicillins    Family History  Problem Relation Age of Onset  . Diabetes Mother   . Heart attack Father   . Cancer Paternal Aunt   . Diabetes Maternal Grandmother   . Arthritis Maternal Grandmother   . Arthritis Maternal Grandfather   . Arthritis Paternal Grandmother   . Diabetes Paternal Grandfather   . Arthritis Paternal Grandfather     Past medical history, social, surgical and family history all reviewed in  electronic medical record.  No pertanent information unless stated regarding to the chief complaint.   Review of Systems:Review of systems updated and as accurate as of 05/31/16  No headache, visual changes, nausea, vomiting, diarrhea, constipation, dizziness, abdominal pain, skin rash, fevers, chills, night sweats, weight loss, swollen lymph nodes, body aches, joint swelling, muscle aches, chest pain, shortness of breath, mood changes.   Objective  Blood pressure 112/78, pulse 70, height 5\' 5"  (1.651 m), weight 166 lb (75.3 kg), SpO2 97 %. Systems examined below as of 05/31/16   General: No apparent distress alert and oriented x3 mood and affect normal, dressed appropriately.  HEENT: Pupils equal, extraocular movements intact  Respiratory: Patient's speak in full sentences and does not appear short of breath  Cardiovascular: No lower extremity edema, non tender, no erythema  Skin: Warm dry intact with no signs of infection or rash on extremities or on axial skeleton.  Abdomen: Soft nontender  Neuro: Cranial nerves II through XII are intact, neurovascularly intact in all extremities with 2+ DTRs and 2+ pulses.  Lymph: No lymphadenopathy of posterior or anterior cervical chain or axillae bilaterally.  Gait normal with good balance and coordination.  MSK:  Non tender with full range of motion and good stability and symmetric strength and tone of  elbows, wrist, hip, knee and ankles bilaterally.  Shoulder: left Inspection reveals no abnormalities, atrophy or asymmetry. Palpation  is normal with no tenderness over AC joint or bicipital groove. ROM is full in all planes passively. Rotator cuff strength normal throughout. No signs of impingement Speeds and Yergason's tests normal. No labral pathology noted with negative Obrien's, negative clunk and good stability. Normal scapular function observed. No painful arc and no drop arm sign. No apprehension sign  Neck: Inspection unremarkable. No  palpable stepoffs. Negative Spurling's maneuver. Lacking the last 2 of extension and last 5 of left-sided side bending Grip strength and sensation normal in bilateral hands Strength good C4 to T1 distribution No sensory change to C4 to T1 Negative Hoffman sign bilaterally Reflexes normal Tightness noted in the left trapezius  MSK US performed of: Left shoulder This study was ordered, performed, and interpreted by Terrilee FilesZach Smith D.O.  Shoulder:   Supraspinatus:  Appears normal on long and transverse views, no bursal bulge seen with shoulder abduction on impingement view. Infraspinatus:  Appears normal on long and transverse views. Subscapularis:  Appears normal on long and transverse views. Teres Minor:  Appears normal on long and transverse views. AC joint:  Capsule undistended, no geyser sign. Glenohumeral Joint:  Appears normal without effusion. Glenoid Labrum:  Intact without visualized tears. Biceps Tendon:  Appears normal on long and transverse views, no fraying of tendon, tendon located in intertubercular groove, no subluxation with shoulder internal or external rotation. No increased power doppler signal. Impression:normal shoulder ultrasound.   Osteopathic findings Cervical C2 flexed rotated and side bent right C4 flexed rotated and side bent left C6 flexed rotated and side bent left T3 extended rotated and side bent left inhaled third rib T9 extended rotated and side bent left   Procedure note 97110; 15 minutes spent for Therapeutic exercises as stated in above notes.  This included exercises focusing on stretching, strengthening, with significant focus on eccentric aspects.  Exercises that included:  Basic scapular stabilization to include adduction and depression of scapula Scaption, focusing on proper movement and good control Internal and External rotation utilizing a theraband, with elbow tucked at side entire time Rows with theraband which was given   Proper  technique shown and discussed handout in great detail with ATC.  All questions were discussed and answered.       Impression and Recommendations:     This case required medical decision making of moderate complexity.      Note: This dictation was prepared with Dragon dictation along with smaller phrase technology. Any transcriptional errors that result from this process are unintentional.

## 2016-05-31 ENCOUNTER — Ambulatory Visit (INDEPENDENT_AMBULATORY_CARE_PROVIDER_SITE_OTHER): Payer: Managed Care, Other (non HMO) | Admitting: Family Medicine

## 2016-05-31 ENCOUNTER — Ambulatory Visit: Payer: Self-pay

## 2016-05-31 ENCOUNTER — Encounter: Payer: Self-pay | Admitting: Family Medicine

## 2016-05-31 VITALS — BP 112/78 | HR 70 | Ht 65.0 in | Wt 166.0 lb

## 2016-05-31 DIAGNOSIS — M25512 Pain in left shoulder: Secondary | ICD-10-CM | POA: Diagnosis not present

## 2016-05-31 DIAGNOSIS — M999 Biomechanical lesion, unspecified: Secondary | ICD-10-CM | POA: Diagnosis not present

## 2016-05-31 DIAGNOSIS — M940 Chondrocostal junction syndrome [Tietze]: Secondary | ICD-10-CM | POA: Diagnosis not present

## 2016-05-31 MED ORDER — GABAPENTIN 100 MG PO CAPS: 200.0000 mg | ORAL_CAPSULE | Freq: Every day | ORAL | 3 refills | Status: DC

## 2016-05-31 NOTE — Assessment & Plan Note (Signed)
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 We discussed icing regimen and home exercise. We discussed posture and ergonomics. We discussed which are Aggie Cosierheresa doing which ones to avoid. Differential includes a cervical radiculopathy and patient was given gabapentin. We discussed over-the-counter medications a could be beneficial as well. Patient will make these changes, can see me again in 4-6 weeks.

## 2016-05-31 NOTE — Assessment & Plan Note (Signed)
Decision today to treat with OMT was based on Physical Exam  After verbal consent patient was treated with HVLA, ME, FPR techniques in cervical, thoracic, rib areas  Patient tolerated the procedure well with improvement in symptoms  Patient given exercises, stretches and lifestyle modifications  See medications in patient instructions if given  Patient will follow up in 4 weeks 

## 2016-05-31 NOTE — Patient Instructions (Signed)
Good to see you  On wall with heels, butt shoulder and head touching for a goal of 5 minutes daily  Exercises 3 times a week.  Gabapentin 200mg  at night Over the counter Vitamin D 2000 IU dialy  Turmeric 500mg  daily  We did manipulation today and your body loved it See me again in 4 weeks.

## 2016-06-30 NOTE — Progress Notes (Deleted)
[facility] [address] [place], [place] [address] Phone: [phone] Subjective:    I'm seeing this patient by the request  of:  Myrlene Broker, MD   CC: Left shoulder pain  [name]  Courtney Hampton is a 31 y.o. female coming in with complaint of left shoulder pain. Patient states that going on for several months. Patient was concern it was more breast pain and did discuss with her gynecologist including having a mammogram and ultrasound without any significant findings. Patient does not remember any true injury. Patient states he only thing that seemed to help was a muscle relaxer but unfortunately made her too tired. Has not notice any specific triggers. Over-the-counter anti-inflammatories don't help. Denies radiation down the arm. Rates the severity pain as 5 out of 10. Not affecting daily activities. States that there is just an aching sensation at all times.     Past Medical History:  Diagnosis Date  . Arthritis   . Fainting spell    when younger   No past surgical history on file. Social History   Social History  . Marital status: Single    Spouse name: N/A  . Number of children: N/A  . Years of education: N/A   Social History Main Topics  . Smoking status: Never Smoker  . Smokeless tobacco: Never Used  . Alcohol use No  . Drug use: No  . Sexual activity: Yes    Birth control/ protection: IUD   Other Topics Concern  . Not on file   Social History Narrative  . No narrative on file   Allergies  Allergen Reactions  . Penicillins    Family History  Problem Relation Age of Onset  . Diabetes Mother   . Heart attack Father   . Cancer Paternal Aunt   . Diabetes Maternal Grandmother   . Arthritis Maternal Grandmother   . Arthritis Maternal Grandfather   . Arthritis Paternal Grandmother   . Diabetes Paternal Grandfather   . Arthritis Paternal Grandfather     Past medical history, social, surgical and family  history all reviewed in electronic medical record.  No pertanent information unless stated regarding to the chief complaint.   Review of Systems:Review of systems updated and as accurate as of 06/30/16  No headache, visual changes, nausea, vomiting, diarrhea, constipation, dizziness, abdominal pain, skin rash, fevers, chills, night sweats, weight loss, swollen lymph nodes, body aches, joint swelling, muscle aches, chest pain, shortness of breath, mood changes.   Objective  There were no vitals taken for this visit. Systems examined below as of 06/30/16   General: No apparent distress alert and oriented x3 mood and affect normal, dressed appropriately.  HEENT: Pupils equal, extraocular movements intact  Respiratory: Patient's speak in full sentences and does not appear short of breath  Cardiovascular: No lower extremity edema, non tender, no erythema  Skin: Warm dry intact with no signs of infection or rash on extremities or on axial skeleton.  Abdomen: Soft nontender  Neuro: Cranial nerves II through XII are intact, neurovascularly intact in all extremities with 2+ DTRs and 2+ pulses.  Lymph: No lymphadenopathy of posterior or anterior cervical chain or axillae bilaterally.  Gait normal with good balance and coordination.  MSK:  Non tender with full range of motion and good stability and symmetric strength and tone of  elbows, wrist, hip, knee and ankles bilaterally.  Shoulder: left Inspection reveals no abnormalities, atrophy or asymmetry. Palpation is normal with no tenderness  over [facility] joint or bicipital groove. ROM is full in all planes passively. Rotator cuff strength normal throughout. No signs of impingement Speeds and Yergason's tests normal. No labral pathology noted with negative Obrien's, negative clunk and good stability. Normal scapular function observed. No painful arc and no drop arm sign. No apprehension sign  Neck: Inspection unremarkable. No palpable  stepoffs. Negative Spurling's maneuver. Lacking the last 2 of extension and last 5 of left-sided side bending Grip strength and sensation normal in bilateral hands Strength good C4 to T1 distribution No sensory change to C4 to T1 Negative Hoffman sign bilaterally Reflexes normal Tightness noted in the left trapezius  MSK US performed of: Left shoulder This study was ordered, performed, and interpreted by Terrilee FilesZach Primus Gritton D.O.  Shoulder:   Supraspinatus:  Appears normal on long and transverse views, no bursal bulge seen with shoulder abduction on impingement view. Infraspinatus:  Appears normal on long and transverse views. Subscapularis:  Appears normal on long and transverse views. Teres Minor:  Appears normal on long and transverse views. AC joint:  Capsule undistended, no geyser sign. Glenohumeral Joint:  Appears normal without effusion. Glenoid Labrum:  Intact without visualized tears. Biceps Tendon:  Appears normal on long and transverse views, no fraying of tendon, tendon located in intertubercular groove, no subluxation with shoulder internal or external rotation. No increased power doppler signal. Impression:normal shoulder ultrasound.   Osteopathic findings Cervical C2 flexed rotated and side bent right C4 flexed rotated and side bent left C6 flexed rotated and side bent left T3 extended rotated and side bent left inhaled third rib T9 extended rotated and side bent left   Procedure note 97110; 15 minutes spent for Therapeutic exercises as stated in above notes.  This included exercises focusing on stretching, strengthening, with significant focus on eccentric aspects.  Exercises that included:  Basic scapular stabilization to include adduction and depression of scapula Scaption, focusing on proper movement and good control Internal and External rotation utilizing a theraband, with elbow tucked at side entire time Rows with theraband which was given   Proper technique  shown and discussed handout in great detail with ATC.  All questions were discussed and answered.       Impression and Recommendations:     This case required medical decision making of moderate complexity.      Note: This dictation was prepared with Dragon dictation along with smaller phrase technology. Any transcriptional errors that result from this process are unintentional.

## 2016-07-01 ENCOUNTER — Ambulatory Visit: Payer: Managed Care, Other (non HMO) | Admitting: Family Medicine

## 2016-07-23 ENCOUNTER — Ambulatory Visit (INDEPENDENT_AMBULATORY_CARE_PROVIDER_SITE_OTHER): Payer: Managed Care, Other (non HMO) | Admitting: Family Medicine

## 2016-07-23 ENCOUNTER — Encounter: Payer: Self-pay | Admitting: Family Medicine

## 2016-07-23 VITALS — BP 110/72 | HR 80 | Ht 65.0 in | Wt 168.0 lb

## 2016-07-23 DIAGNOSIS — M999 Biomechanical lesion, unspecified: Secondary | ICD-10-CM | POA: Diagnosis not present

## 2016-07-23 DIAGNOSIS — M940 Chondrocostal junction syndrome [Tietze]: Secondary | ICD-10-CM | POA: Diagnosis not present

## 2016-07-23 NOTE — Assessment & Plan Note (Signed)
Decision today to treat with OMT was based on Physical Exam  After verbal consent patient was treated with HVLA, ME, FPR techniques in cervical, thoracic, rib areas  Patient tolerated the procedure well with improvement in symptoms  Patient given exercises, stretches and lifestyle modifications  See medications in patient instructions if given  Patient will follow up in 8 weeks 

## 2016-07-23 NOTE — Progress Notes (Signed)
Pre visit review using our clinic review tool, if applicable. No additional management support is needed unless otherwise documented below in the visit note. 

## 2016-07-23 NOTE — Progress Notes (Signed)
[facility] Meredeth Furber D.O. [facility] [address] [place], [place] [address] Phone: [phone] Subjective:     CC: Left shoulder pain f/u   BJY:NWGNFAOZHYHPI:Subjective  Courtney Hampton is a 31 y.o. female coming in with complaint of left shoulder pain. 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 Started on home exercises, vitamin supplementaton. Icing regimen. Patient states doing significantly better overall. Starting to have some increasing discomfort again in the left side of the shoulder. Some in the neck. Denies any radiation down the arm or any numbness or tingling.     Past Medical History:  Diagnosis Date  . Arthritis   . Fainting spell    when younger   No past surgical history on file. Social History   Social History  . Marital status: Single    Spouse name: N/A  . Number of children: N/A  . Years of education: N/A   Social History Main Topics  . Smoking status: Never Smoker  . Smokeless tobacco: Never Used  . Alcohol use No  . Drug use: No  . Sexual activity: Yes    Birth control/ protection: IUD   Other Topics Concern  . None   Social History Narrative  . None   Allergies  Allergen Reactions  . Penicillins    Family History  Problem Relation Age of Onset  . Diabetes Mother   . Heart attack Father   . Cancer Paternal Aunt   . Diabetes Maternal Grandmother   . Arthritis Maternal Grandmother   . Arthritis Maternal Grandfather   . Arthritis Paternal Grandmother   . Diabetes Paternal Grandfather   . Arthritis Paternal Grandfather     Past medical history, social, surgical and family history all reviewed in electronic medical record.  No pertanent information unless stated regarding to the chief complaint.   Review of Systems: No headache, visual changes, nausea, vomiting, diarrhea, constipation, dizziness, abdominal pain, skin rash, fevers, chills, night sweats, weight loss, swollen lymph nodes, body aches, joint swelling, muscle aches, chest pain,  shortness of breath, mood changes.    Objective  Blood pressure 110/72, pulse 80, height 5\' 5"  (1.651 m), weight 168 lb (76.2 kg), SpO2 98 %.   Systems examined below as of 07/23/16 General: NAD A&O x3 mood, affect normal  HEENT: Pupils equal, extraocular movements intact no nystagmus Respiratory: not short of breath at rest or with speaking Cardiovascular: No lower extremity edema, non tender Skin: Warm dry intact with no signs of infection or rash on extremities or on axial skeleton. Abdomen: Soft nontender, no masses Neuro: Cranial nerves  intact, neurovascularly intact in all extremities with 2+ DTRs and 2+ pulses. Lymph: No lymphadenopathy appreciated today  Gait normal with good balance and coordination.  MSK: Non tender with full range of motion and good stability and symmetric strength and tone of shoulders, elbows, wrist,  knee hips and ankles bilaterally.   Neck: Inspection unremarkable. No palpable stepoffs. Negative Spurling's maneuver. Lacks 10 degrees of movement in all planes.  Grip strength and sensation normal in bilateral hands Strength good C4 to T1 distribution No sensory change to C4 to T1 Negative Hoffman sign bilaterally Reflexes normal  Osteopathic findings C4 flexed rotated and side bent left C6 flexed rotated and side bent left T3 extended rotated and side bent right inhaled third rib T9 extended rotated and side bent left L2 flexed rotated and side bent right    Impression and Recommendations:     This case required medical  decision making of moderate complexity.      Note: This dictation was prepared with Dragon dictation along with smaller phrase technology. Any transcriptional errors that result from this process are unintentional.

## 2016-07-23 NOTE — Assessment & Plan Note (Signed)
Stable overall. Discussed icing regimen and HEP  Need to continue to work on ergonomics.  Posture will be key  Responded to omt RTC in 8 weeks.

## 2016-07-23 NOTE — Patient Instructions (Signed)
You are awesome Doing great  Posture will be key  Do not change a thing See me again in 2 months!!!!

## 2016-09-05 LAB — HM PAP SMEAR

## 2016-09-10 ENCOUNTER — Encounter: Payer: Self-pay | Admitting: Nurse Practitioner

## 2016-09-10 ENCOUNTER — Ambulatory Visit (INDEPENDENT_AMBULATORY_CARE_PROVIDER_SITE_OTHER): Payer: Managed Care, Other (non HMO) | Admitting: Nurse Practitioner

## 2016-09-10 VITALS — BP 110/68 | HR 85 | Temp 98.6°F | Ht 65.0 in | Wt 169.0 lb

## 2016-09-10 DIAGNOSIS — Z13220 Encounter for screening for lipoid disorders: Secondary | ICD-10-CM | POA: Diagnosis not present

## 2016-09-10 DIAGNOSIS — Z0001 Encounter for general adult medical examination with abnormal findings: Secondary | ICD-10-CM | POA: Diagnosis not present

## 2016-09-10 DIAGNOSIS — Z713 Dietary counseling and surveillance: Secondary | ICD-10-CM | POA: Diagnosis not present

## 2016-09-10 DIAGNOSIS — L729 Follicular cyst of the skin and subcutaneous tissue, unspecified: Secondary | ICD-10-CM

## 2016-09-10 DIAGNOSIS — Z136 Encounter for screening for cardiovascular disorders: Secondary | ICD-10-CM

## 2016-09-10 DIAGNOSIS — K5900 Constipation, unspecified: Secondary | ICD-10-CM | POA: Diagnosis not present

## 2016-09-10 MED ORDER — PHENTERMINE HCL 37.5 MG PO CAPS: 37.5000 mg | ORAL_CAPSULE | ORAL | 0 refills | Status: DC

## 2016-09-10 MED ORDER — POLYETHYLENE GLYCOL 3350 17 GM/SCOOP PO POWD: 17.0000 g | Freq: Every day | ORAL | 1 refills | Status: AC

## 2016-09-10 NOTE — Progress Notes (Signed)
Subjective:    Patient ID: Courtney Hampton, female    DOB: Dec 01, 1985, 30 y.o.   MRN: [id number]  Patient presents today for complete physical  Rash  This is a recurrent problem. The current episode started more than 1 month ago. The problem has been waxing and waning since onset. The affected locations include the back. The rash is characterized by draining and swelling. She was exposed to nothing. Pertinent negatives include no anorexia, congestion, cough, diarrhea, fatigue, fever, joint pain, nail changes, rhinorrhea, shortness of breath, sore throat or vomiting. Treatments tried: warm compress. The treatment provided significant relief. Her past medical history is significant for allergies.  Constipation  This is a chronic problem. The current episode started more than 1 year ago. The problem has been waxing and waning since onset. Her stool frequency is 1 time per week or less. The stool is described as firm and pellet like. The patient is not on a high fiber diet. She does not exercise regularly. There has not been adequate water intake. Associated symptoms include bloating. Pertinent negatives include no abdominal pain, anorexia, back pain, diarrhea, difficulty urinating, fecal incontinence, fever, flatus, hematochezia, hemorrhoids, melena, nausea, rectal pain, vomiting or weight loss. Risk factors include obesity and stress. She has tried laxatives for the symptoms. The treatment provided moderate relief.    patient request for labs to be drawn and resulted by [facility].  Weight loss: She will like help with weight loss. Has difficulty incorporating exercise due to work schedule and family life. Admits to not maintain healthy diet. snacks on fatty and high carb food items.  Immunizations: (TDAP, Hep C screen, Pneumovax, Influenza, zoster)  Health Maintenance  Topic Date Due  . HIV Screening  03/31/1999  . Flu Shot  09/08/2017*  . Pap Smear  04/10/2017  . Tetanus Vaccine  04/11/2024   *Topic was postponed. The date shown is not the original due date.   Diet:regular.  Weight:  Wt Readings from Last 3 Encounters:  09/10/16 169 lb (76.7 kg)  07/23/16 168 lb (76.2 kg)  05/31/16 166 lb (75.3 kg)   Exercise:walking.  Fall Risk: Fall Risk  09/10/2016  Falls in the past year? No   Home Safety:home with husband  Depression/Suicide: Depression screen [facility] 2/9 09/10/2016  Decreased Interest 0  Down, Depressed, Hopeless 0  PHQ - 2 Score 0   Pap Smear (every 22yrs for >21-29 without HPV, every 51yrs for >30-67yrs with HPV):up to date  Vision:up to date Dental:up to date  Medications and allergies reviewed with patient and updated if appropriate.  Patient Active Problem List   Diagnosis Date Noted  . Constipation 09/10/2016  . Weight loss counseling, encounter for 09/10/2016  . Subcutaneous cyst 09/10/2016  . 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 Nonallopathic lesion of rib cage 05/31/2016  . Nonallopathic lesion of cervical region 05/31/2016  . Nonallopathic lesion of thoracic region 05/31/2016  . Left shoulder pain 04/12/2016  . Breast pain, left 02/13/2016  . Frequent headaches 12/07/2015  . Hx of Clostridium difficile infection 12/07/2015  . Carpal tunnel syndrome 04/14/2014    Current Outpatient Prescriptions on File Prior to Visit  Medication Sig Dispense Refill  . cyclobenzaprine (FLEXERIL) 5 MG tablet Take 1 tablet (5 mg total) by mouth 3 (three) times daily as needed for muscle spasms. 30 tablet 1  . gabapentin (NEURONTIN) 100 MG capsule Take 2 capsules (200 mg total) by mouth at bedtime. 60 capsule 3   No current facility-administered medications on file  prior to visit.     Past Medical History:  Diagnosis Date  . Arthritis   . Fainting spell    when younger    History reviewed. No pertinent surgical history.  Social History   Social History  . Marital status: Single    Spouse name: N/A  . Number of children: N/A  . Years of education: N/A    Social History Main Topics  . Smoking status: Never Smoker  . Smokeless tobacco: Never Used  . Alcohol use No  . Drug use: No  . Sexual activity: Yes    Birth control/ protection: Pill   Other Topics Concern  . None   Social History Narrative  . None    Family History  Problem Relation Age of Onset  . Diabetes Mother   . Irritable bowel syndrome Mother   . Heart attack Father 56  . Cancer Paternal Aunt 91       breast cancer metastasis to colon  . Diabetes Maternal Grandmother   . Irritable bowel syndrome Maternal Grandmother   . Arthritis Maternal Grandmother        rhaumatoid  . Arthritis Maternal Grandfather   . Cancer Maternal Grandfather        lymphoma  . Arthritis Paternal Grandmother        rheumatoid  . Diabetes Paternal Grandfather   . Arthritis Paternal Grandfather   . Heart attack Paternal Grandfather         Review of Systems  Constitutional: Negative for fatigue, fever, malaise/fatigue and weight loss.  HENT: Negative for congestion, rhinorrhea and sore throat.   Eyes:       Negative for visual changes  Respiratory: Negative for cough and shortness of breath.   Cardiovascular: Negative for chest pain, palpitations and leg swelling.  Gastrointestinal: Positive for bloating and constipation. Negative for abdominal pain, anorexia, blood in stool, diarrhea, flatus, heartburn, hematochezia, hemorrhoids, melena, nausea, rectal pain and vomiting.  Genitourinary: Negative for difficulty urinating, dysuria, frequency and urgency.  Musculoskeletal: Negative for back pain, falls, joint pain and myalgias.  Skin: Positive for rash. Negative for nail changes.  Neurological: Negative for dizziness, sensory change and headaches.  Endo/Heme/Allergies: Does not bruise/bleed easily.  Psychiatric/Behavioral: Negative for depression, substance abuse and suicidal ideas. The patient is not nervous/anxious and does not have insomnia.     Objective:   Vitals:    09/10/16 1100  BP: 110/68  Pulse: 85  Temp: 98.6 F (37 C)  SpO2: 99%    Body mass index is 28.12 kg/m.   Physical Examination:  Physical Exam  Constitutional: She is oriented to person, place, and time and well-developed, well-nourished, and in no distress. No distress.  HENT:  Right Ear: External ear normal.  Left Ear: External ear normal.  Nose: Nose normal.  Mouth/Throat: Oropharynx is clear and moist. No oropharyngeal exudate.  Eyes: Pupils are equal, round, and reactive to light. Conjunctivae and EOM are normal. No scleral icterus.  Neck: Normal range of motion. Neck supple. No thyromegaly present.  Cardiovascular: Normal rate, normal heart sounds and intact distal pulses.   Pulmonary/Chest: Effort normal and breath sounds normal. She exhibits no tenderness.  Abdominal: Soft. Bowel sounds are normal. She exhibits no distension. There is no tenderness. There is no rebound and no guarding.  Genitourinary:  Genitourinary Comments: Pelvic and breast deferred to GYN per patient.  Musculoskeletal: Normal range of motion. She exhibits no edema or tenderness.  Lymphadenopathy:    She has no cervical adenopathy.  Neurological: She is alert and oriented to person, place, and time. Gait normal.  Skin: Skin is warm and dry. Rash noted. Rash is nodular. No erythema.     Psychiatric: Affect and judgment normal.  Vitals reviewed.   ASSESSMENT and PLAN:  Courtney Hampton was seen today for annual exam.  Diagnoses and all orders for this visit:  Encounter for preventative adult health care exam with abnormal findings -     Cancel: Comprehensive metabolic panel; Future -     Cancel: Lipid panel; Future -     Cancel: Hemoglobin A1c; Future -     Cancel: CBC; Future -     Cancel: HIV antibody; Future -     CBC; Future -     Hemoglobin A1c; Future -     Comprehensive metabolic panel; Future -     Lipid panel; Future -     Cancel: HIV antibody; Future  Subcutaneous cyst -      Ambulatory referral to Dermatology  Weight loss counseling, encounter for -     phentermine 37.5 MG capsule; Take 1 capsule (37.5 mg total) by mouth every morning.  Encounter for lipid screening for cardiovascular disease -     Cancel: Lipid panel; Future -     Lipid panel; Future  Constipation, unspecified constipation type -     polyethylene glycol powder (GLYCOLAX/MIRALAX) powder; Take 17 g by mouth daily.   No problem-specific Assessment & Plan notes found for this encounter.     Follow up: Return in about 4 weeks (around 10/08/2016) for weight loss with Dr. Okey Duprerawford.  Alysia Pennaharlotte Torri Langston, NP

## 2016-09-10 NOTE — Patient Instructions (Addendum)
You will be contacted to schedule appt with dermatology.  Go to basement for blood draw. You will be called with results.   Calorie Counting for Weight Loss Calories are units of energy. Your body needs a certain amount of calories from food to keep you going throughout the day. When you eat more calories than your body needs, your body stores the extra calories as fat. When you eat fewer calories than your body needs, your body burns fat to get the energy it needs. Calorie counting means keeping track of how many calories you eat and drink each day. Calorie counting can be helpful if you need to lose weight. If you make sure to eat fewer calories than your body needs, you should lose weight. Ask your health care provider what a healthy weight is for you. For calorie counting to work, you will need to eat the right number of calories in a day in order to lose a healthy amount of weight per week. A dietitian can help you determine how many calories you need in a day and will give you suggestions on how to reach your calorie goal.  A healthy amount of weight to lose per week is usually 1-2 lb (0.5-0.9 kg). This usually means that your daily calorie intake should be reduced by 500-750 calories.  Eating 1,200 - 1,500 calories per day can help most women lose weight.  Eating 1,500 - 1,800 calories per day can help most men lose weight.  What is my plan? My goal is to have ___1500_______ calories per day. If I have this many calories per day, I should lose around _______1___ pounds per week. What do I need to know about calorie counting? In order to meet your daily calorie goal, you will need to:  Find out how many calories are in each food you would like to eat. Try to do this before you eat.  Decide how much of the food you plan to eat.  Write down what you ate and how many calories it had. Doing this is called keeping a food log.  To successfully lose weight, it is important to balance  calorie counting with a healthy lifestyle that includes regular activity. Aim for 150 minutes of moderate exercise (such as walking) or 75 minutes of vigorous exercise (such as running) each week. Where do I find calorie information?  The number of calories in a food can be found on a Nutrition Facts label. If a food does not have a Nutrition Facts label, try to look up the calories online or ask your dietitian for help. Remember that calories are listed per serving. If you choose to have more than one serving of a food, you will have to multiply the calories per serving by the amount of servings you plan to eat. For example, the label on a package of bread might say that a serving size is 1 slice and that there are 90 calories in a serving. If you eat 1 slice, you will have eaten 90 calories. If you eat 2 slices, you will have eaten 180 calories. How do I keep a food log? Immediately after each meal, record the following information in your food log:  What you ate. Don't forget to include toppings, sauces, and other extras on the food.  How much you ate. This can be measured in cups, ounces, or number of items.  How many calories each food and drink had.  The total number of calories in the meal.  Keep your food log near you, such as in a small notebook in your pocket, or use a mobile app or website. Some programs will calculate calories for you and show you how many calories you have left for the day to meet your goal. What are some calorie counting tips?  Use your calories on foods and drinks that will fill you up and not leave you hungry: ? Some examples of foods that fill you up are nuts and nut butters, vegetables, lean proteins, and high-fiber foods like whole grains. High-fiber foods are foods with more than 5 g fiber per serving. ? Drinks such as sodas, specialty coffee drinks, alcohol, and juices have a lot of calories, yet do not fill you up.  Eat nutritious foods and avoid empty  calories. Empty calories are calories you get from foods or beverages that do not have many vitamins or protein, such as candy, sweets, and soda. It is better to have a nutritious high-calorie food (such as an avocado) than a food with few nutrients (such as a bag of chips).  Know how many calories are in the foods you eat most often. This will help you calculate calorie counts faster.  Pay attention to calories in drinks. Low-calorie drinks include water and unsweetened drinks.  Pay attention to nutrition labels for "low fat" or "fat free" foods. These foods sometimes have the same amount of calories or more calories than the full fat versions. They also often have added sugar, starch, or salt, to make up for flavor that was removed with the fat.  Find a way of tracking calories that works for you. Get creative. Try different apps or programs if writing down calories does not work for you. What are some portion control tips?  Know how many calories are in a serving. This will help you know how many servings of a certain food you can have.  Use a measuring cup to measure serving sizes. You could also try weighing out portions on a kitchen scale. With time, you will be able to estimate serving sizes for some foods.  Take some time to put servings of different foods on your favorite plates, bowls, and cups so you know what a serving looks like.  Try not to eat straight from a bag or box. Doing this can lead to overeating. Put the amount you would like to eat in a cup or on a plate to make sure you are eating the right portion.  Use smaller plates, glasses, and bowls to prevent overeating.  Try not to multitask (for example, watch TV or use your computer) while eating. If it is time to eat, sit down at a table and enjoy your food. This will help you to know when you are full. It will also help you to be aware of what you are eating and how much you are eating. What are tips for following this  plan? Reading food labels  Check the calorie count compared to the serving size. The serving size may be smaller than what you are used to eating.  Check the source of the calories. Make sure the food you are eating is high in vitamins and protein and low in saturated and trans fats. Shopping  Read nutrition labels while you shop. This will help you make healthy decisions before you decide to purchase your food.  Make a grocery list and stick to it. Cooking  Try to cook your favorite foods in a healthier way. For example,  try baking instead of frying.  Use low-fat dairy products. Meal planning  Use more fruits and vegetables. Half of your plate should be fruits and vegetables.  Include lean proteins like poultry and fish. How do I count calories when eating out?  Ask for smaller portion sizes.  Consider sharing an entree and sides instead of getting your own entree.  If you get your own entree, eat only half. Ask for a box at the beginning of your meal and put the rest of your entree in it so you are not tempted to eat it.  If calories are listed on the menu, choose the lower calorie options.  Choose dishes that include vegetables, fruits, whole grains, low-fat dairy products, and lean protein.  Choose items that are boiled, broiled, grilled, or steamed. Stay away from items that are buttered, battered, fried, or served with cream sauce. Items labeled "crispy" are usually fried, unless stated otherwise.  Choose water, low-fat milk, unsweetened iced tea, or other drinks without added sugar. If you want an alcoholic beverage, choose a lower calorie option such as a glass of wine or light beer.  Ask for dressings, sauces, and syrups on the side. These are usually high in calories, so you should limit the amount you eat.  If you want a salad, choose a garden salad and ask for grilled meats. Avoid extra toppings like bacon, cheese, or fried items. Ask for the dressing on the side,  or ask for olive oil and vinegar or lemon to use as dressing.  Estimate how many servings of a food you are given. For example, a serving of cooked rice is  cup or about the size of half a baseball. Knowing serving sizes will help you be aware of how much food you are eating at restaurants. The list below tells you how big or small some common portion sizes are based on everyday objects: ? 1 oz-4 stacked dice. ? 3 oz-1 deck of cards. ? 1 tsp-1 die. ? 1 Tbsp- a ping-pong ball. ? 2 Tbsp-1 ping-pong ball. ?  cup- baseball. ? 1 cup-1 baseball. Summary  Calorie counting means keeping track of how many calories you eat and drink each day. If you eat fewer calories than your body needs, you should lose weight.  A healthy amount of weight to lose per week is usually 1-2 lb (0.5-0.9 kg). This usually means reducing your daily calorie intake by 500-750 calories.  The number of calories in a food can be found on a Nutrition Facts label. If a food does not have a Nutrition Facts label, try to look up the calories online or ask your dietitian for help.  Use your calories on foods and drinks that will fill you up, and not on foods and drinks that will leave you hungry.  Use smaller plates, glasses, and bowls to prevent overeating. This information is not intended to replace advice given to you by your health care provider. Make sure you discuss any questions you have with your health care provider. Document Released: 12/24/2004 Document Revised: 11/24/2015 Document Reviewed: 11/24/2015 Elsevier Interactive Patient Education  2017 [employer]   Exercising to [employer] Exercising can help you to lose weight. In order to lose weight through exercise, you need to do vigorous-intensity exercise. You can tell that you are exercising with vigorous intensity if you are breathing very hard and fast and cannot hold a conversation while exercising. Moderate-intensity exercise helps to maintain your  current weight. You can tell  that you are exercising at a moderate level if you have a higher heart rate and faster breathing, but you are still able to hold a conversation. How often should I exercise? Choose an activity that you enjoy and set realistic goals. Your health care provider can help you to make an activity plan that works for you. Exercise regularly as directed by your health care provider. This may include:  Doing resistance training twice each week, such as: ? Push-ups. ? Sit-ups. ? Lifting weights. ? Using resistance bands.  Doing a given intensity of exercise for a given amount of time. Choose from these options: ? 150 minutes of moderate-intensity exercise every week. ? 75 minutes of vigorous-intensity exercise every week. ? A mix of moderate-intensity and vigorous-intensity exercise every week.  Children, pregnant women, people who are out of shape, people who are overweight, and older adults may need to consult a health care provider for individual recommendations. If you have any sort of medical condition, be sure to consult your health care provider before starting a new exercise program. What are some activities that can help me to lose weight?  Walking at a rate of at least 4.5 miles an hour.  Jogging or running at a rate of 5 miles per hour.  Biking at a rate of at least 10 miles per hour.  Lap swimming.  Roller-skating or in-line skating.  Cross-country skiing.  Vigorous competitive sports, such as football, basketball, and soccer.  Jumping rope.  Aerobic dancing. How can I be more active in my day-to-day activities?  Use the stairs instead of the elevator.  Take a walk during your lunch break.  If you drive, park your car farther away from work or school.  If you take public transportation, get off one stop early and walk the rest of the way.  Make all of your phone calls while standing up and walking around.  Get up, stretch, and walk around  every 30 minutes throughout the day. What guidelines should I follow while exercising?  Do not exercise so much that you hurt yourself, feel dizzy, or get very short of breath.  Consult your health care provider prior to starting a new exercise program.  Wear comfortable clothes and shoes with good support.  Drink plenty of water while you exercise to prevent dehydration or heat stroke. Body water is lost during exercise and must be replaced.  Work out until you breathe faster and your heart beats faster. This information is not intended to replace advice given to you by your health care provider. Make sure you discuss any questions you have with your health care provider. Document Released: 01/26/2010 Document Revised: 06/01/2015 Document Reviewed: 05/27/2013 Elsevier Interactive Patient Education  [employer].

## 2016-09-19 ENCOUNTER — Encounter: Payer: Self-pay | Admitting: Internal Medicine

## 2016-09-19 NOTE — Progress Notes (Signed)
Abstracted and sent to scan  

## 2016-09-23 ENCOUNTER — Ambulatory Visit: Payer: Managed Care, Other (non HMO) | Admitting: Family Medicine

## 2016-09-25 ENCOUNTER — Encounter: Payer: Self-pay | Admitting: Nurse Practitioner

## 2016-10-28 ENCOUNTER — Ambulatory Visit: Payer: Managed Care, Other (non HMO) | Admitting: Family Medicine

## 2017-02-11 ENCOUNTER — Encounter: Payer: Self-pay | Admitting: Internal Medicine

## 2017-03-03 ENCOUNTER — Encounter: Payer: Self-pay | Admitting: Internal Medicine

## 2017-03-03 ENCOUNTER — Ambulatory Visit: Payer: Managed Care, Other (non HMO) | Admitting: Internal Medicine

## 2017-03-03 DIAGNOSIS — R6889 Other general symptoms and signs: Secondary | ICD-10-CM | POA: Diagnosis not present

## 2017-03-03 MED ORDER — BENZONATATE 200 MG PO CAPS: 200.0000 mg | ORAL_CAPSULE | Freq: Three times a day (TID) | ORAL | 0 refills | Status: DC | PRN

## 2017-03-03 MED ORDER — HYDROCODONE-HOMATROPINE 5-1.5 MG/5ML PO SYRP: 5.0000 mL | ORAL_SOLUTION | Freq: Every evening | ORAL | 0 refills | Status: DC | PRN

## 2017-03-03 MED ORDER — OSELTAMIVIR PHOSPHATE 75 MG PO CAPS: 75.0000 mg | ORAL_CAPSULE | Freq: Two times a day (BID) | ORAL | 0 refills | Status: DC

## 2017-03-03 NOTE — Patient Instructions (Signed)
We will send in the tamiflu to take 1 pill twice a day for 5 days.  We have sent in the tessalon perles to use up to 3 times per day for cough.  We have sent in cough syrup to use in the evening if needed for cough.  Take zyrtec or claritin to help with drainage.    Influenza, Adult Influenza, more commonly known as "the flu," is a viral infection that primarily affects the respiratory tract. The respiratory tract includes organs that help you breathe, such as the lungs, nose, and throat. The flu causes many common cold symptoms, as well as a high fever and body aches. The flu spreads easily from person to person (is contagious). Getting a flu shot (influenza vaccination) every year is the best way to prevent influenza. What are the causes? Influenza is caused by a virus. You can catch the virus by:  Breathing in droplets from an infected person's cough or sneeze.  Touching something that was recently contaminated with the virus and then touching your mouth, nose, or eyes.  What increases the risk? The following factors may make you more likely to get the flu:  Not cleaning your hands frequently with soap and water or alcohol-based hand sanitizer.  Having close contact with many people during cold and flu season.  Touching your mouth, eyes, or nose without washing or sanitizing your hands first.  Not drinking enough fluids or not eating a healthy diet.  Not getting enough sleep or exercise.  Being under a high amount of stress.  Not getting a yearly (annual) flu shot.  You may be at a higher risk of complications from the flu, such as a severe lung infection (pneumonia), if you:  Are over the age of 3.  Are pregnant.  Have a weakened disease-fighting system (immune system). You may have a weakened immune system if you: ? Have HIV or AIDS. ? Are undergoing chemotherapy. ? Aretaking medicines that reduce the activity of (suppress) the immune system.  Have a long-term  (chronic) illness, such as heart disease, kidney disease, diabetes, or lung disease.  Have a liver disorder.  Are obese.  Have anemia.  What are the signs or symptoms? Symptoms of this condition typically last 4-10 days and may include:  Fever.  Chills.  Headache, body aches, or muscle aches.  Sore throat.  Cough.  Runny or congested nose.  Chest discomfort and cough.  Poor appetite.  Weakness or tiredness (fatigue).  Dizziness.  Nausea or vomiting.  How is this diagnosed? This condition may be diagnosed based on your medical history and a physical exam. Your health care provider may do a nose or throat swab test to confirm the diagnosis. How is this treated? If influenza is detected early, you can be treated with antiviral medicine that can reduce the length of your illness and the severity of your symptoms. This medicine may be given by mouth (orally) or through an IV tube that is inserted in one of your veins. The goal of treatment is to relieve symptoms by taking care of yourself at home. This may include taking over-the-counter medicines, drinking plenty of fluids, and adding humidity to the air in your home. In some cases, influenza goes away on its own. Severe influenza or complications from influenza may be treated in a hospital. Follow these instructions at home:  Take over-the-counter and prescription medicines only as told by your health care provider.  Use a cool mist humidifier to add humidity to  the air in your home. This can make breathing easier.  Rest as needed.  Drink enough fluid to keep your urine clear or pale yellow.  Cover your mouth and nose when you cough or sneeze.  Wash your hands with soap and water often, especially after you cough or sneeze. If soap and water are not available, use hand sanitizer.  Stay home from work or school as told by your health care provider. Unless you are visiting your health care provider, try to avoid  leaving home until your fever has been gone for 24 hours without the use of medicine.  Keep all follow-up visits as told by your health care provider. This is important. How is this prevented?  Getting an annual flu shot is the best way to avoid getting the flu. You may get the flu shot in late summer, fall, or winter. Ask your health care provider when you should get your flu shot.  Wash your hands often or use hand sanitizer often.  Avoid contact with people who are sick during cold and flu season.  Eat a healthy diet, drink plenty of fluids, get enough sleep, and exercise regularly. Contact a health care provider if:  You develop new symptoms.  You have: ? Chest pain. ? Diarrhea. ? A fever.  Your cough gets worse.  You produce more mucus.  You feel nauseous or you vomit. Get help right away if:  You develop shortness of breath or difficulty breathing.  Your skin or nails turn a bluish color.  You have severe pain or stiffness in your neck.  You develop a sudden headache or sudden pain in your face or ear.  You cannot stop vomiting. This information is not intended to replace advice given to you by your health care provider. Make sure you discuss any questions you have with your health care provider. Document Released: 12/22/1999 Document Revised: 06/01/2015 Document Reviewed: 10/18/2014 Elsevier Interactive Patient Education  2017 [employer]

## 2017-03-03 NOTE — Progress Notes (Signed)
   Subjective:    Patient ID: Courtney Hampton, female    DOB: September 13, 1985, 32 y.o.   MRN: [id number]  HPI The patient is a 32 YO female coming in for fever and flu like symptoms. Started about 1 day ago. Overall worsening. Some headaches and chills and body aches. Some cough and congestion. Has tried dayquil and this did not help to get rid of the fevers. Denies nausea or vomiting. Overall worsening. Sick contacts.   [place] narcotic database reviewed and appropriate  Review of Systems  Constitutional: Positive for activity change, appetite change, chills and fever. Negative for fatigue and unexpected weight change.  HENT: Positive for congestion, postnasal drip, rhinorrhea and sinus pressure. Negative for ear discharge, ear pain, sinus pain, sneezing, sore throat, tinnitus, trouble swallowing and voice change.   Eyes: Negative.   Respiratory: Positive for cough. Negative for chest tightness, shortness of breath and wheezing.   Cardiovascular: Negative.   Gastrointestinal: Negative.   Musculoskeletal: Positive for myalgias.  Neurological: Positive for headaches.      Objective:   Physical Exam  Constitutional: She is oriented to person, place, and time. She appears well-developed and well-nourished.  Appears ill  HENT:  Head: Normocephalic and atraumatic.  Oropharynx with redness and clear drainage, nose with swollen turbinates, TMs normal bilaterally  Eyes: EOM are normal.  Neck: Normal range of motion. No thyromegaly present.  Cardiovascular: Normal rate and regular rhythm.  Pulmonary/Chest: Effort normal and breath sounds normal. No respiratory distress. She has no wheezes. She has no rales.  Abdominal: Soft.  Musculoskeletal: She exhibits tenderness.  Lymphadenopathy:    She has no cervical adenopathy.  Neurological: She is alert and oriented to person, place, and time.  Skin: Skin is warm and dry.   Vitals:   03/03/17 1258  BP: 120/80  Pulse: (!) 105  Temp: (!) 102.3 F  (39.1 C)  TempSrc: Oral  SpO2: 99%  Weight: 165 lb (74.8 kg)  Height: 5\' 5"  (1.651 m)      Assessment & Plan:

## 2017-03-03 NOTE — Assessment & Plan Note (Addendum)
Rx for tamiflu as within window for treatment. Rx for tessalon perles and hycodan cough syrup. [place] narcotic database reviewed and appropriate. Push fluids and counseled about typical course and duration as well as risk of contagion to others.

## 2017-03-05 ENCOUNTER — Telehealth: Payer: Self-pay

## 2017-03-05 NOTE — Telephone Encounter (Signed)
PA started on CoverMyMeds KEY: [id number]

## 2017-03-06 NOTE — Telephone Encounter (Signed)
PA not needed covered by insurance

## 2017-04-29 ENCOUNTER — Ambulatory Visit: Payer: Self-pay | Admitting: *Deleted

## 2017-04-29 NOTE — Telephone Encounter (Signed)
Pt calling stating that on yesterday she noted that her stool was like red clay and noted that she also had bright red blood on tissue. Pt states it was a small amount of blood noted on the tissue. Pt states that today she also noted blood on the tissue but not in the stool or in the toilet and states that the stool was formed and brown in color. Pt states she usually has a stool once a day without difficulty.Pt denies any constipation, hemrrhoids or pain to the rectal area. Pt states she had dental work done recently and was given a course of antibiotics that was completed last Thursday. Pt states she also had a root canal done on today and was concerned about taking with antibiotics may be the cause of having blood in her stools. Pt also states she has been drinking Kombucha drinks that have probiotics in them since she has been taking antibiotics. Explained to pt that with taking antibiotics, bloody stools is not typically a side effect. Pt scheduled for appt on 4/25. Pt advised to call office back if bleeding increased or is she began to have dizziness or felt worse before seen in office for appt. Pt verbalized understanding.  Reason for Disposition . MILD rectal bleeding (more than just a few drops or streaks)  Answer Assessment - Initial Assessment Questions 1. APPEARANCE of BLOOD: "What color is it?" "Is it passed separately, on the surface of the stool, or mixed in with the stool?"      Today was brown like normal, but yesterday stool looked like red clay  2. AMOUNT: "How much blood was passed?"      Blood on tissue but not in toilet today 3. FREQUENCY: "How many times has blood been passed with the stools?"      2 times, once yesterday and once today 4. ONSET: "When was the blood first seen in the stools?" (Days or weeks)      yesterday 5. DIARRHEA: "Is there also some diarrhea?" If so, ask: "How many diarrhea stools were passed in past 24 hours?"      No 6. CONSTIPATION: "Do you have  constipation?" If so, "How bad is it?"     No 7. RECURRENT SYMPTOMS: "Have you had blood in your stools before?" If so, ask: "When was the last time?" and "What happened that time?"      No 8. BLOOD THINNERS: "Do you take any blood thinners?" (e.g., Coumadin/warfarin, Pradaxa/dabigatran, aspirin)     No 9. OTHER SYMPTOMS: "Do you have any other symptoms?"  (e.g., abdominal pain, vomiting, dizziness, fever)     No 10. PREGNANCY: "Is there any chance you are pregnant?" "When was your last menstrual period?"       No pregnancy, last period- first week of April  Protocols used: RECTAL BLEEDING-A-AH

## 2017-05-01 ENCOUNTER — Encounter: Payer: Self-pay | Admitting: Internal Medicine

## 2017-05-01 ENCOUNTER — Ambulatory Visit: Payer: Managed Care, Other (non HMO) | Admitting: Internal Medicine

## 2017-05-01 DIAGNOSIS — K921 Melena: Secondary | ICD-10-CM | POA: Insufficient documentation

## 2017-05-01 NOTE — Assessment & Plan Note (Signed)
Will monitor for any recurrence. Could be related to stomach irritation from antibiotic or hemorrhoid. If recurrence will warrant further evaluation.

## 2017-05-01 NOTE — Progress Notes (Signed)
   Subjective:    Patient ID: Courtney Hampton, female    DOB: 1985/07/25, 32 y.o.   MRN: [id number]  HPI The patient is a 32 YO female coming in for 1 episode of blood in stool. She is taking erythromycin for dental procedure. Had 1 episode red clay looking stool. Then some blood with wiping. The next day she also had some blood with wiping. Today things have been normal. Denies fevers or chills. Denies diarrhea. Has hx of c dif and taking probiotic with the antibiotic. Denies hemorrhoids known.   Review of Systems  Constitutional: Negative.   Respiratory: Negative for cough, chest tightness and shortness of breath.   Cardiovascular: Negative for chest pain, palpitations and leg swelling.  Gastrointestinal: Positive for blood in stool. Negative for abdominal distention, abdominal pain, anal bleeding, constipation, diarrhea, nausea and vomiting.  Musculoskeletal: Negative.   Skin: Negative.   Neurological: Negative.       Objective:   Physical Exam  Constitutional: She is oriented to person, place, and time. She appears well-developed and well-nourished.  HENT:  Head: Normocephalic and atraumatic.  Eyes: EOM are normal.  Neck: Normal range of motion.  Cardiovascular: Normal rate and regular rhythm.  Pulmonary/Chest: Effort normal and breath sounds normal. No respiratory distress. She has no wheezes. She has no rales.  Abdominal: Soft. Bowel sounds are normal. She exhibits no distension. There is no tenderness. There is no rebound.  Musculoskeletal: She exhibits no edema.  Neurological: She is alert and oriented to person, place, and time. Coordination normal.  Skin: Skin is warm and dry.   Vitals:   05/01/17 1324  BP: 98/60  Pulse: 70  Temp: 98.2 F (36.8 C)  TempSrc: Oral  SpO2: 99%  Weight: 167 lb (75.8 kg)  Height: 5\' 5"  (1.651 m)      Assessment & Plan:

## 2017-05-01 NOTE — Patient Instructions (Signed)
We will wait and watch. If this comes back let us know.

## 2017-06-17 ENCOUNTER — Ambulatory Visit: Payer: Managed Care, Other (non HMO) | Admitting: Internal Medicine

## 2017-06-17 ENCOUNTER — Encounter: Payer: Self-pay | Admitting: Internal Medicine

## 2017-06-17 DIAGNOSIS — F419 Anxiety disorder, unspecified: Secondary | ICD-10-CM | POA: Diagnosis not present

## 2017-06-17 MED ORDER — CLONAZEPAM 0.5 MG PO TABS: 0.5000 mg | ORAL_TABLET | Freq: Two times a day (BID) | ORAL | 1 refills | Status: AC | PRN

## 2017-06-17 NOTE — Progress Notes (Signed)
   Subjective:    Patient ID: Courtney Hampton, female    DOB: Sep 11, 1985, 32 y.o.   MRN: [id number]  HPI  C/o nervous and anxiety she has controlled with yoga and running, Denies worsening depressive symptoms, suicidal ideation, or panic; but hands and body shaking and unable to stop, lost everything important and money.  Pt denies chest pain, increased sob or doe, wheezing, orthopnea, PND, increased LE swelling, palpitations, dizziness or syncope.  Has not taken ay psych meds in the past.  Willing for counseling  No SI or HI Past Medical History:  Diagnosis Date  . Arthritis   . Fainting spell    when younger   No past surgical history on file.  reports that she has never smoked. She has never used smokeless tobacco. She reports that she does not drink alcohol or use drugs. family history includes Arthritis in her maternal grandfather, maternal grandmother, paternal grandfather, and paternal grandmother; Cancer in her maternal grandfather; Cancer (age of onset: 6550) in her paternal aunt; Diabetes in her maternal grandmother, mother, and paternal grandfather; Heart attack in her paternal grandfather; Heart attack (age of onset: 5252) in her father; Irritable bowel syndrome in her maternal grandmother and mother. Allergies  Allergen Reactions  . Penicillins    Current Outpatient Medications on File Prior to Visit  Medication Sig Dispense Refill  . cyclobenzaprine (FLEXERIL) 5 MG tablet Take 1 tablet (5 mg total) by mouth 3 (three) times daily as needed for muscle spasms. 30 tablet 1  . polyethylene glycol powder (GLYCOLAX/MIRALAX) powder Take 17 g by mouth daily. 3350 g 1   No current facility-administered medications on file prior to visit.    Review of Systems  All otherwise neg per pt    Objective:   Physical Exam BP 120/78   Pulse 78   Temp 98.7 F (37.1 C) (Oral)   Ht 5\' 5"  (1.651 m)   Wt 164 lb (74.4 kg)   SpO2 97%   BMI 27.29 kg/m  VS noted,  Constitutional: Pt appears in  NAD HENT: Head: NCAT.  Right Ear: External ear normal.  Left Ear: External ear normal.  Eyes: . Pupils are equal, round, and reactive to light. Conjunctivae and EOM are normal Nose: without d/c or deformity Neck: Neck supple. Gross normal ROM Cardiovascular: Normal rate and regular rhythm.   Pulmonary/Chest: Effort normal and breath sounds without rales or wheezing.  Neurological: Pt is alert. At baseline orientation, motor grossly intact Skin: Skin is warm. No rashes, other new lesions, no LE edema Psychiatric: Pt behavior is normal without agitation, 2+ nervous  No other exam fidnings      Assessment & Plan:

## 2017-06-17 NOTE — Patient Instructions (Signed)
Please take all new medication as prescribed - the clonazepam  You will be contacted regarding the referral for: counseling  Please continue all other medications as before, and refills have been done if requested.  Please have the pharmacy call with any other refills you may need.  Please keep your appointments with your specialists as you may have planned

## 2017-06-17 NOTE — Assessment & Plan Note (Signed)
situatonal mild to mod without panic, but cannot function well, for limited klonopin 0.5 bid prn, refer counseling,  to f/u any worsening symptoms or concerns

## 2019-01-21 ENCOUNTER — Other Ambulatory Visit: Payer: Self-pay | Admitting: Cardiology

## 2019-01-21 DIAGNOSIS — Z20822 Contact with and (suspected) exposure to covid-19: Secondary | ICD-10-CM

## 2019-01-22 LAB — NOVEL CORONAVIRUS, NAA: SARS-CoV-2, NAA: NOT DETECTED

## 2023-07-14 ENCOUNTER — Emergency Department (HOSPITAL_BASED_OUTPATIENT_CLINIC_OR_DEPARTMENT_OTHER)

## 2023-07-14 ENCOUNTER — Emergency Department (HOSPITAL_BASED_OUTPATIENT_CLINIC_OR_DEPARTMENT_OTHER): Admitting: Radiology

## 2023-07-14 ENCOUNTER — Emergency Department (HOSPITAL_BASED_OUTPATIENT_CLINIC_OR_DEPARTMENT_OTHER)
Admission: EM | Admit: 2023-07-14 | Discharge: 2023-07-14 | Disposition: A | Discharge status: Home / Self Care | Attending: Student | Admitting: Student

## 2023-07-14 ENCOUNTER — Other Ambulatory Visit: Payer: Self-pay

## 2023-07-14 ENCOUNTER — Encounter (HOSPITAL_BASED_OUTPATIENT_CLINIC_OR_DEPARTMENT_OTHER): Payer: Self-pay

## 2023-07-14 DIAGNOSIS — M545 Low back pain, unspecified: Secondary | ICD-10-CM | POA: Insufficient documentation

## 2023-07-14 NOTE — ED Triage Notes (Signed)
 Pt c/o back pain, sharp stinging/ tingling sensation down back started 6/25, xray at [facility] 6/28 was neg- muscle spasms. States that grinding feeling started Monday 6/30 after UC visit. Last Wednesday 7/2, called PCP for follow up, denied & advised to finish muscle relaxer. UC again 7/3, trying to move, felt pop on L side. New- onset numbness  On 3 muscle relaxer & ibuprofen

## 2023-07-14 NOTE — Discharge Instructions (Addendum)
 Evaluation today was overall reassuring.  Please follow-up with your spine medical provider on Friday as scheduled.  Also recommend you follow-up with your PCP.  In the meantime recommend that you continue medications.  Lower back pain is prescribed to you.  If you develop saddle numbness, urinary or bowel incontinence or retention, fever or new trauma to your back please return to the ED for further evaluation.

## 2023-07-14 NOTE — ED Provider Notes (Signed)
 [place] [facility] DEPARTMENT AT [facility] Provider Note   CSN: [id number] Arrival date & time: 07/14/23  1540     Patient presents with: Numbness  HPI Courtney Hampton is a 38 y.o. female presenting for lower back pain and numbness.  She states she may have her back on 625 after dancing.  Since then she has been seen by urgent care and has been on muscle relaxers and ibuprofen.  She is planned to see a spine medical provider on Friday.  She states that today she was twisted around in her desk chair at home when she felt a pop in the left side.  After that she felt a few seconds of numbness starting in the left elbow and extending to the left fingers and the left knee extending to the left toes.  She states that the numbness lasted for a few seconds and went away.  This was concerning to her which prompted her evaluation here today.  She denies urinary or bowel changes, fever, saddle anesthesia or IV drug use.   HPI     Prior to Admission medications   Medication Sig Start Date End Date Taking? Authorizing Provider  clonazePAM  (KLONOPIN ) 0.5 MG tablet Take 1 tablet (0.5 mg total) by mouth 2 (two) times daily as needed for anxiety. 06/17/17   Norleen Lynwood ORN, MD  cyclobenzaprine  (FLEXERIL ) 5 MG tablet Take 1 tablet (5 mg total) by mouth 3 (three) times daily as needed for muscle spasms. 02/13/16   Rollene Almarie LABOR, MD  polyethylene glycol powder (GLYCOLAX /MIRALAX ) powder Take 17 g by mouth daily. 09/10/16   Nche, Roselie Rockford, NP    Allergies: Penicillins    Review of Systems See HPI  Updated Vital Signs BP (!) 113/94 (BP Location: Right Arm)   Pulse 88   Temp (!) 97.5 F (36.4 C)   Resp 16   SpO2 100%   Physical Exam Constitutional:      Appearance: Normal appearance.  HENT:     Head: Normocephalic.     Nose: Nose normal.  Eyes:     Conjunctiva/sclera: Conjunctivae normal.  Pulmonary:     Effort: Pulmonary effort is normal.  Musculoskeletal:     Lumbar  back: Normal. No swelling, edema, deformity, signs of trauma, spasms or tenderness. Normal range of motion.     Comments: Range motion of the lower back is normal without tenderness.  No tenderness of the midline.  Ambulatory with steady gait.  Neurological:     Mental Status: She is alert.     Comments: GCS 15. Speech is goal oriented. No deficits appreciated to CN III-XII; symmetric eyebrow raise, no facial drooping, tongue midline. Patient has equal grip strength bilaterally with 5/5 strength against resistance in all major muscle groups bilaterally. Sensation to light touch intact. Patient moves extremities without ataxia. Normal finger-nose-finger. Patient ambulatory with steady gait.  Psychiatric:        Mood and Affect: Mood normal.     (all labs ordered are listed, but only abnormal results are displayed) Labs Reviewed - No data to display  EKG: None  Radiology: CT Lumbar Spine Wo Contrast Result Date: 07/14/2023 CLINICAL DATA:  Low back pain, cauda equina syndrome suspected EXAM: CT LUMBAR SPINE WITHOUT CONTRAST TECHNIQUE: Multidetector CT imaging of the lumbar spine was performed without intravenous contrast administration. Multiplanar CT image reconstructions were also generated. RADIATION DOSE REDUCTION: This exam was performed according to the departmental dose-optimization program which includes automated exposure control, adjustment of the  mA and/or kV according to patient size and/or use of iterative reconstruction technique. COMPARISON:  None Available. FINDINGS: Segmentation: 5 lumbar type vertebrae. Alignment: Physiologic. Vertebrae: Intact and unremarkable. Paraspinal and other soft tissues: Normal. Disc levels: Disc spaces are all well preserved. The spinal canal and neural foramina are widely patent throughout the lumbar spine. IMPRESSION: Normal. Electronically Signed   By: Evalene Coho M.D.   On: 07/14/2023 18:18     Procedures   Medications Ordered in the ED - No  data to display                                  Medical Decision Making Amount and/or Complexity of Data Reviewed Radiology: ordered.   38 year old well-appearing female presenting for lower back pain and numbness. Exam was unremarkable.  Considered stroke but unlikely given reassuring neuroexam and resolution of her symptoms. Considered more sinister pathology for back pain but unlikely given lack of red flag symptoms. Advised her to follow-up with her PCP and go to her scheduled appointment on Friday with spine medical provider.  Discussed return precautions.  Advised her to continue taking NSAIDs and muscle relaxer as prescribed to her.  Also recommended conservative treatment.  Discharged.      Final diagnoses:  Low back pain, unspecified back pain laterality, unspecified chronicity, unspecified whether sciatica present    ED Discharge Orders     None          Lang Norleen POUR, PA-C 07/14/23 1850    Kommor, Lum, MD 07/15/23 1730
